# Patient Record
Sex: Female | Born: 1945 | Race: White | Hispanic: No | Smoking: Never smoker
Health system: Southern US, Community
[De-identification: ages and names within clinical notes are randomized; demographics above are authoritative.]

## PROBLEM LIST (undated history)

## (undated) DIAGNOSIS — I712 Thoracic aortic aneurysm, without rupture, unspecified: Secondary | ICD-10-CM

## (undated) DIAGNOSIS — I2510 Atherosclerotic heart disease of native coronary artery without angina pectoris: Secondary | ICD-10-CM

## (undated) DIAGNOSIS — I10 Essential (primary) hypertension: Secondary | ICD-10-CM

## (undated) DIAGNOSIS — M1990 Unspecified osteoarthritis, unspecified site: Secondary | ICD-10-CM

## (undated) DIAGNOSIS — T7840XA Allergy, unspecified, initial encounter: Secondary | ICD-10-CM

## (undated) DIAGNOSIS — I471 Supraventricular tachycardia, unspecified: Secondary | ICD-10-CM

## (undated) DIAGNOSIS — M810 Age-related osteoporosis without current pathological fracture: Secondary | ICD-10-CM

## (undated) DIAGNOSIS — R7611 Nonspecific reaction to tuberculin skin test without active tuberculosis: Secondary | ICD-10-CM

## (undated) HISTORY — PX: TONSILLECTOMY: SUR1361

## (undated) HISTORY — DX: Nonspecific reaction to tuberculin skin test without active tuberculosis: R76.11

## (undated) HISTORY — DX: Thoracic aortic aneurysm, without rupture, unspecified: I71.20

## (undated) HISTORY — DX: Allergy, unspecified, initial encounter: T78.40XA

## (undated) HISTORY — DX: Supraventricular tachycardia, unspecified: I47.10

## (undated) HISTORY — DX: Unspecified osteoarthritis, unspecified site: M19.90

## (undated) HISTORY — DX: Essential (primary) hypertension: I10

## (undated) HISTORY — DX: Age-related osteoporosis without current pathological fracture: M81.0

## (undated) HISTORY — DX: Atherosclerotic heart disease of native coronary artery without angina pectoris: I25.10

---

## 2020-02-26 ENCOUNTER — Emergency Department (HOSPITAL_COMMUNITY): Payer: Medicare Other

## 2020-02-26 ENCOUNTER — Emergency Department (HOSPITAL_COMMUNITY)
Admission: EM | Admit: 2020-02-26 | Discharge: 2020-02-27 | Disposition: A | Discharge status: Home / Self Care | Payer: Medicare Other | Attending: Emergency Medicine | Admitting: Emergency Medicine

## 2020-02-26 ENCOUNTER — Other Ambulatory Visit: Payer: Self-pay

## 2020-02-26 ENCOUNTER — Encounter (HOSPITAL_COMMUNITY): Payer: Self-pay

## 2020-02-26 DIAGNOSIS — R0789 Other chest pain: Secondary | ICD-10-CM | POA: Diagnosis present

## 2020-02-26 DIAGNOSIS — I10 Essential (primary) hypertension: Secondary | ICD-10-CM | POA: Insufficient documentation

## 2020-02-26 DIAGNOSIS — Z91040 Latex allergy status: Secondary | ICD-10-CM | POA: Insufficient documentation

## 2020-02-26 LAB — CBC
HCT: 42.6 % (ref 36.0–46.0)
Hemoglobin: 14.1 g/dL (ref 12.0–15.0)
MCH: 30.1 pg (ref 26.0–34.0)
MCHC: 33.1 g/dL (ref 30.0–36.0)
MCV: 91 fL (ref 80.0–100.0)
Platelets: 227 10*3/uL (ref 150–400)
RBC: 4.68 MIL/uL (ref 3.87–5.11)
RDW: 12.4 % (ref 11.5–15.5)
WBC: 6.6 10*3/uL (ref 4.0–10.5)
nRBC: 0 % (ref 0.0–0.2)

## 2020-02-26 LAB — BASIC METABOLIC PANEL
Anion gap: 11 (ref 5–15)
BUN: 12 mg/dL (ref 8–23)
CO2: 26 mmol/L (ref 22–32)
Calcium: 9.2 mg/dL (ref 8.9–10.3)
Chloride: 104 mmol/L (ref 98–111)
Creatinine, Ser: 0.8 mg/dL (ref 0.44–1.00)
GFR calc Af Amer: >60 mL/min (ref >60)
GFR calc non Af Amer: >60 mL/min (ref >60)
Glucose, Bld: 102 mg/dL — ABNORMAL HIGH (ref 70–99)
Potassium: 3.8 mmol/L (ref 3.5–5.1)
Sodium: 141 mmol/L (ref 135–145)

## 2020-02-26 LAB — TROPONIN I (HIGH SENSITIVITY)
Troponin I (High Sensitivity): 3 ng/L (ref <18)
Troponin I (High Sensitivity): 3 ng/L (ref <18)

## 2020-02-26 NOTE — ED Triage Notes (Signed)
Pt sent here from [facility] for further evaluation of chest tightness that lasted about 30 mins along with a funny feeling in her head. Pt denies any pain at this time. No other associated symptoms.

## 2020-02-27 DIAGNOSIS — R0789 Other chest pain: Secondary | ICD-10-CM | POA: Diagnosis not present

## 2020-02-27 MED ORDER — AMLODIPINE BESYLATE 10 MG PO TABS: 5.0000 mg | ORAL_TABLET | Freq: Every day | ORAL | 0 refills | Status: DC

## 2020-02-27 MED ORDER — AMLODIPINE BESYLATE 5 MG PO TABS
10.0000 mg | ORAL_TABLET | Freq: Once | ORAL | Status: AC
  Administered 2020-02-27: 10 mg via ORAL
  Filled 2020-02-27: qty 2

## 2020-02-27 MED ORDER — NITROGLYCERIN 0.4 MG SL SUBL
0.4000 mg | SUBLINGUAL_TABLET | SUBLINGUAL | 0 refills | Status: DC | PRN
Start: 2020-02-27 — End: 2023-06-03

## 2020-02-27 NOTE — Discharge Instructions (Signed)
Instructions  I started you on a medication for your high blood pressure called amlodipine.  Your instructions are as follows.  When you wake up in the morning, check your blood pressure immediately after waking up.  Do this with your feet planted on the ground and sitting upright.  If your blood pressure is less than 120 mmhg systolic (top number), do not take any medication.  If your blood pressure is between 120 mmhg and 140 mmhg systolic (top number), take 5 mg of amlodipine.  If your blood pressure is above 150 mmhg systolic (top number), or the bottom number is above diastolic, take 10 mg of amlodipine.  Please call your primary care doctor's office in [place] today to discuss your high blood pressure management moving forward.  Try not to eat salty foods or processed foods with high sodium.   We also talked about your chest pain.  Today your cardiac work-up was unremarkable.  However, I still would strongly recommend that you follow-up with a cardiologist, given that you have had chest tightness for several months.  You can contact your primary care doctor in [place] or your husband's cardiologist to try to arrange for rapid follow-up appointment in 1-2 weeks.  I prescribed nitroglycerin tablets for you to carry.  If you ever experience significant chest pain or tightness, place 1 tablet under your tongue and call 911.  If it helps with your pain, you can take up to 3 tablets in total, once every 5 minutes, until you are pain free.   You should still come to the ER whether or not the tablets are helping you.

## 2020-02-27 NOTE — ED Provider Notes (Signed)
[facility] EMERGENCY DEPARTMENT Provider Note   CSN: [id number] Arrival date & time: 02/26/20  1558     History Chief Complaint  Patient presents with  . Chest Pain    Angela Daniels is a 74 y.o. female with a history of hypertension (not on BP meds) presented to emergency department with high blood pressure and chest tightness.  Patient originally lives in [place] and is here in [place] visiting family.  She reports that for the past several weeks, she has intermittently noted lightheadedness and jitteriness at home.  She has not been regularly taking her blood pressure, but attributed this to "seasonal allergies" which she suffers from, while living in [place].  She has not taken any decongestants regularly for this.    Yesterday she went to an urgent care, where she was noted to be hypertensive, and also reportedly there were concerns for "inferior infarct pattern" on her ECG (which she presents with), and therefore she was told to come to the emergency department.  She unfortunately spent 12 to 14 hours in the waiting room overnight.  Upon my evaluation in the morning, she continues to be hypertensive but denies any jitteriness and reports that her chest tightness is minimal.  She reports that for several months she has noted episodes of chest tightness, which seem to occur while she is at rest only ("when I'm sitting still and thinking about it") but not worse with activity.  She has never seen a cardiologist.  She denies any history of cardiac disease or MI.  She denies any significant family history of MI.  She does not smoke.  She denies history of high cholesterol or diabetes.  Normally her BP has ranged 120-140 mmhg systolic and around 80 mg diastolic, but she does not check every day.  HPI     History reviewed. No pertinent past medical history.  There are no problems to display for this patient.   History reviewed. No pertinent surgical history.   OB  History   No obstetric history on file.     No family history on file.  Social History   Tobacco Use  . Smoking status: Not on file  Substance Use Topics  . Alcohol use: Not on file  . Drug use: Not on file    Home Medications Prior to Admission medications   Medication Sig Start Date End Date Taking? Authorizing Provider  amLODipine (NORVASC) 10 MG tablet Take 0.5 tablets (5 mg total) by mouth daily for 90 doses. 02/27/20 05/27/20  Terald Sleeper, MD  nitroGLYCERIN (NITROSTAT) 0.4 MG SL tablet Place 1 tablet (0.4 mg total) under the tongue every 5 (five) minutes as needed for chest pain. Do not take more than 3 tablets in a row 02/27/20 03/28/20  Terald Sleeper, MD    Allergies    Shellfish allergy, Iodine, Latex, and Sulfa antibiotics  Review of Systems   Review of Systems  Constitutional: Positive for fatigue. Negative for chills and fever.  HENT: Negative for ear pain and sore throat.   Eyes: Negative for pain and visual disturbance.  Respiratory: Negative for cough and shortness of breath.   Cardiovascular: Positive for chest pain. Negative for palpitations.  Gastrointestinal: Negative for abdominal pain, nausea and vomiting.  Genitourinary: Negative for dysuria and hematuria.  Musculoskeletal: Negative for arthralgias and back pain.  Skin: Negative for color change and rash.  Neurological: Positive for light-headedness. Negative for syncope and weakness.  All other systems reviewed and are negative.  Physical Exam Updated Vital Signs BP 131/84   Pulse 75   Temp 98 F (36.7 C) (Oral)   Resp 16   SpO2 100%   Physical Exam Vitals and nursing note reviewed.  Constitutional:      General: She is not in acute distress.    Appearance: She is well-developed.  HENT:     Head: Normocephalic and atraumatic.  Eyes:     Conjunctiva/sclera: Conjunctivae normal.  Cardiovascular:     Rate and Rhythm: Normal rate and regular rhythm.  Pulmonary:     Effort:  Pulmonary effort is normal. No respiratory distress.  Abdominal:     Palpations: Abdomen is soft.     Tenderness: There is no abdominal tenderness.  Musculoskeletal:     Cervical back: Neck supple.  Skin:    General: Skin is warm and dry.  Neurological:     General: No focal deficit present.     Mental Status: She is alert and oriented to person, place, and time.     ED Results / Procedures / Treatments   Labs (all labs ordered are listed, but only abnormal results are displayed) Labs Reviewed  BASIC METABOLIC PANEL - Abnormal; Notable for the following components:      Result Value   Glucose, Bld 102 (*)    All other components within normal limits  CBC  TROPONIN I (HIGH SENSITIVITY)  TROPONIN I (HIGH SENSITIVITY)    EKG EKG Interpretation  Date/Time:  Tuesday February 26 2020 16:14:42 EDT Ventricular Rate:  91 PR Interval:  154 QRS Duration: 82 QT Interval:  356 QTC Calculation: 437 R Axis:   115 Text Interpretation: Normal sinus rhythm Right axis deviation Right ventricular hypertrophy Abnormal ECG No old tracing to compare Confirmed by Dione Booze ([id number]) on 02/27/2020 7:05:19 AM   Radiology DG Chest 2 View  Result Date: 02/26/2020 CLINICAL DATA:  Chest pain and pressure for 1 day, anxiety EXAM: CHEST - 2 VIEW COMPARISON:  None. FINDINGS: Frontal and lateral views of the chest demonstrate an unremarkable cardiac silhouette. No airspace disease, effusion, or pneumothorax. Background emphysema is noted. No acute bony abnormalities. IMPRESSION: 1. Emphysema, no acute intrathoracic process. Electronically Signed   By: Sharlet Salina M.D.   On: 02/26/2020 16:42    Procedures Procedures (including critical care time)  Medications Ordered in ED Medications  amLODipine (NORVASC) tablet 10 mg (10 mg Oral Given 02/27/20 8413)    ED Course  I have reviewed the triage vital signs and the nursing notes.  Pertinent labs & imaging results that were available during my care of  the patient were reviewed by me and considered in my medical decision making (see chart for details).  74 yo female presenting to ED from urgent care with elevated blood pressure.  She reports there were concerns on her ECG for an inferior infarct.   I reviewed a printout of this ECG from urgent care which showed possible Q-waves in leads III and aVF, with low amplitude.  Her ECG here does not show these q waves - I suspect it may have been a lead placement issue.    Per my interpretation her ECG here is a NSR with no evidence of ischemia.  Her delta troponin is flat and unremarkable.  I have a low suspicion overall for ACS at this time.  However, given her HEART score of 3, and her symptoms ongoing for several months, I did make a strong recommendation that she f/u with a cardiologist upon returning  to [place] this week.  Her husband sees a cardiologist - she will try to get in with them.  I also talked about prescribing nitro SL for as needed use this week as she is in transit to go home to [place].  Regarding her HTN, we'll start on norvasc here a 5-10 mg daily regimen, with titration perimeters in her discharge.  She has a BP cuff at home and will keep a log.  She'll need to call her PCP about this issue.  At this time I have a low suspicion for hypertensive emergency, stroke, or ACS.  Cr is 0.8.  Trop is 3 ->3.  BP improved after PO meds here 10 mg norvasc given.  Okay for discharge.  I personally reviewed her labs I personally reviewed her xray which per my interpretation shows no focal consolidation or PTX     Final Clinical Impression(s) / ED Diagnoses Final diagnoses:  Chest pressure  Hypertension, unspecified type    Rx / DC Orders ED Discharge Orders         Ordered    amLODipine (NORVASC) 10 MG tablet  Daily     Discontinue  Reprint     02/27/20 0832    nitroGLYCERIN (NITROSTAT) 0.4 MG SL tablet  Every 5 min PRN     Discontinue  Reprint     02/27/20 4332           Terald Sleeper, MD 02/27/20 [id number]

## 2020-11-22 IMAGING — CR DG CHEST 2V
2 series · 2 of 2 positions shown · non-contrast
Comparison: None.

CLINICAL DATA: Chest pain and pressure for 1 day, anxiety

EXAM:
CHEST - 2 VIEW

[chest pa]
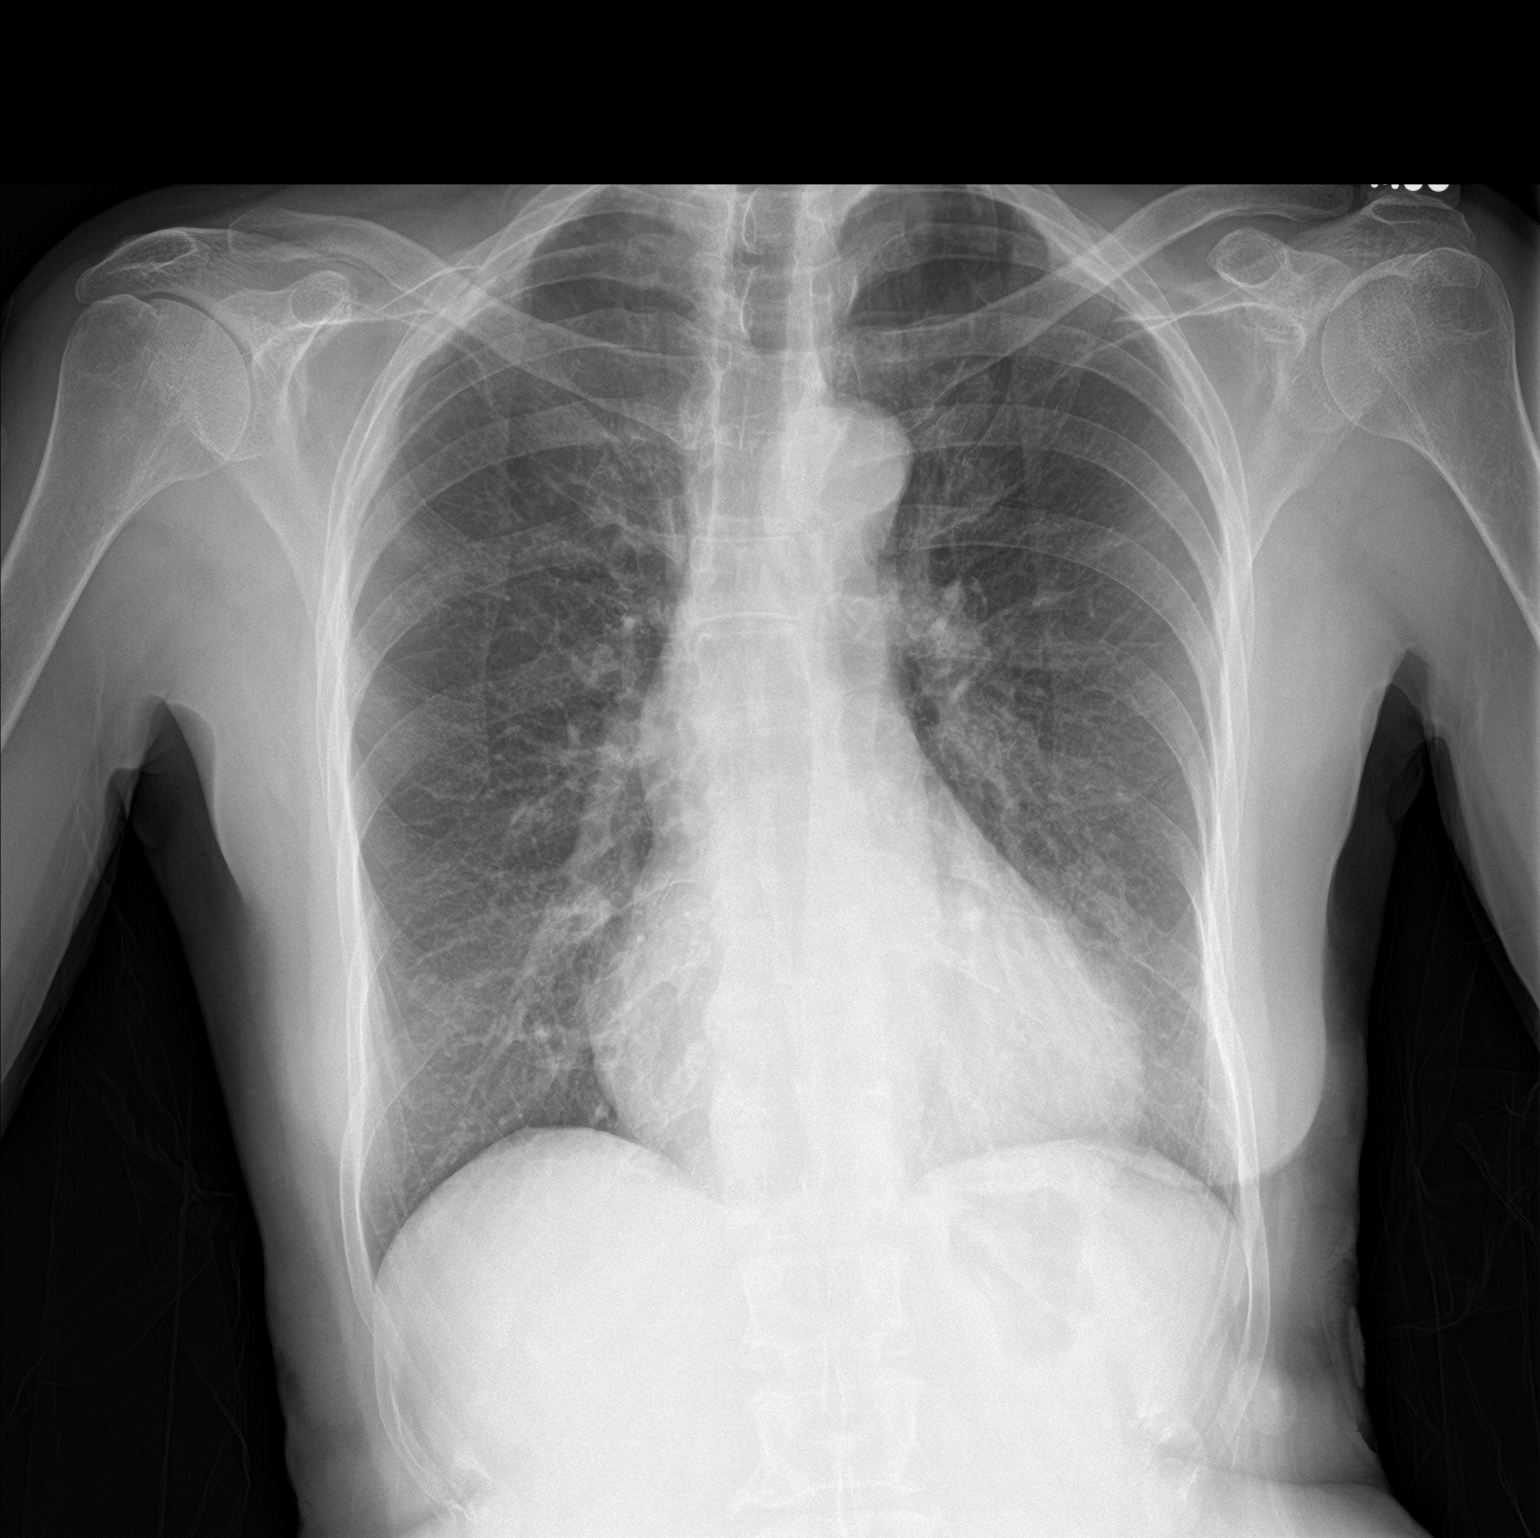

[chest lat]
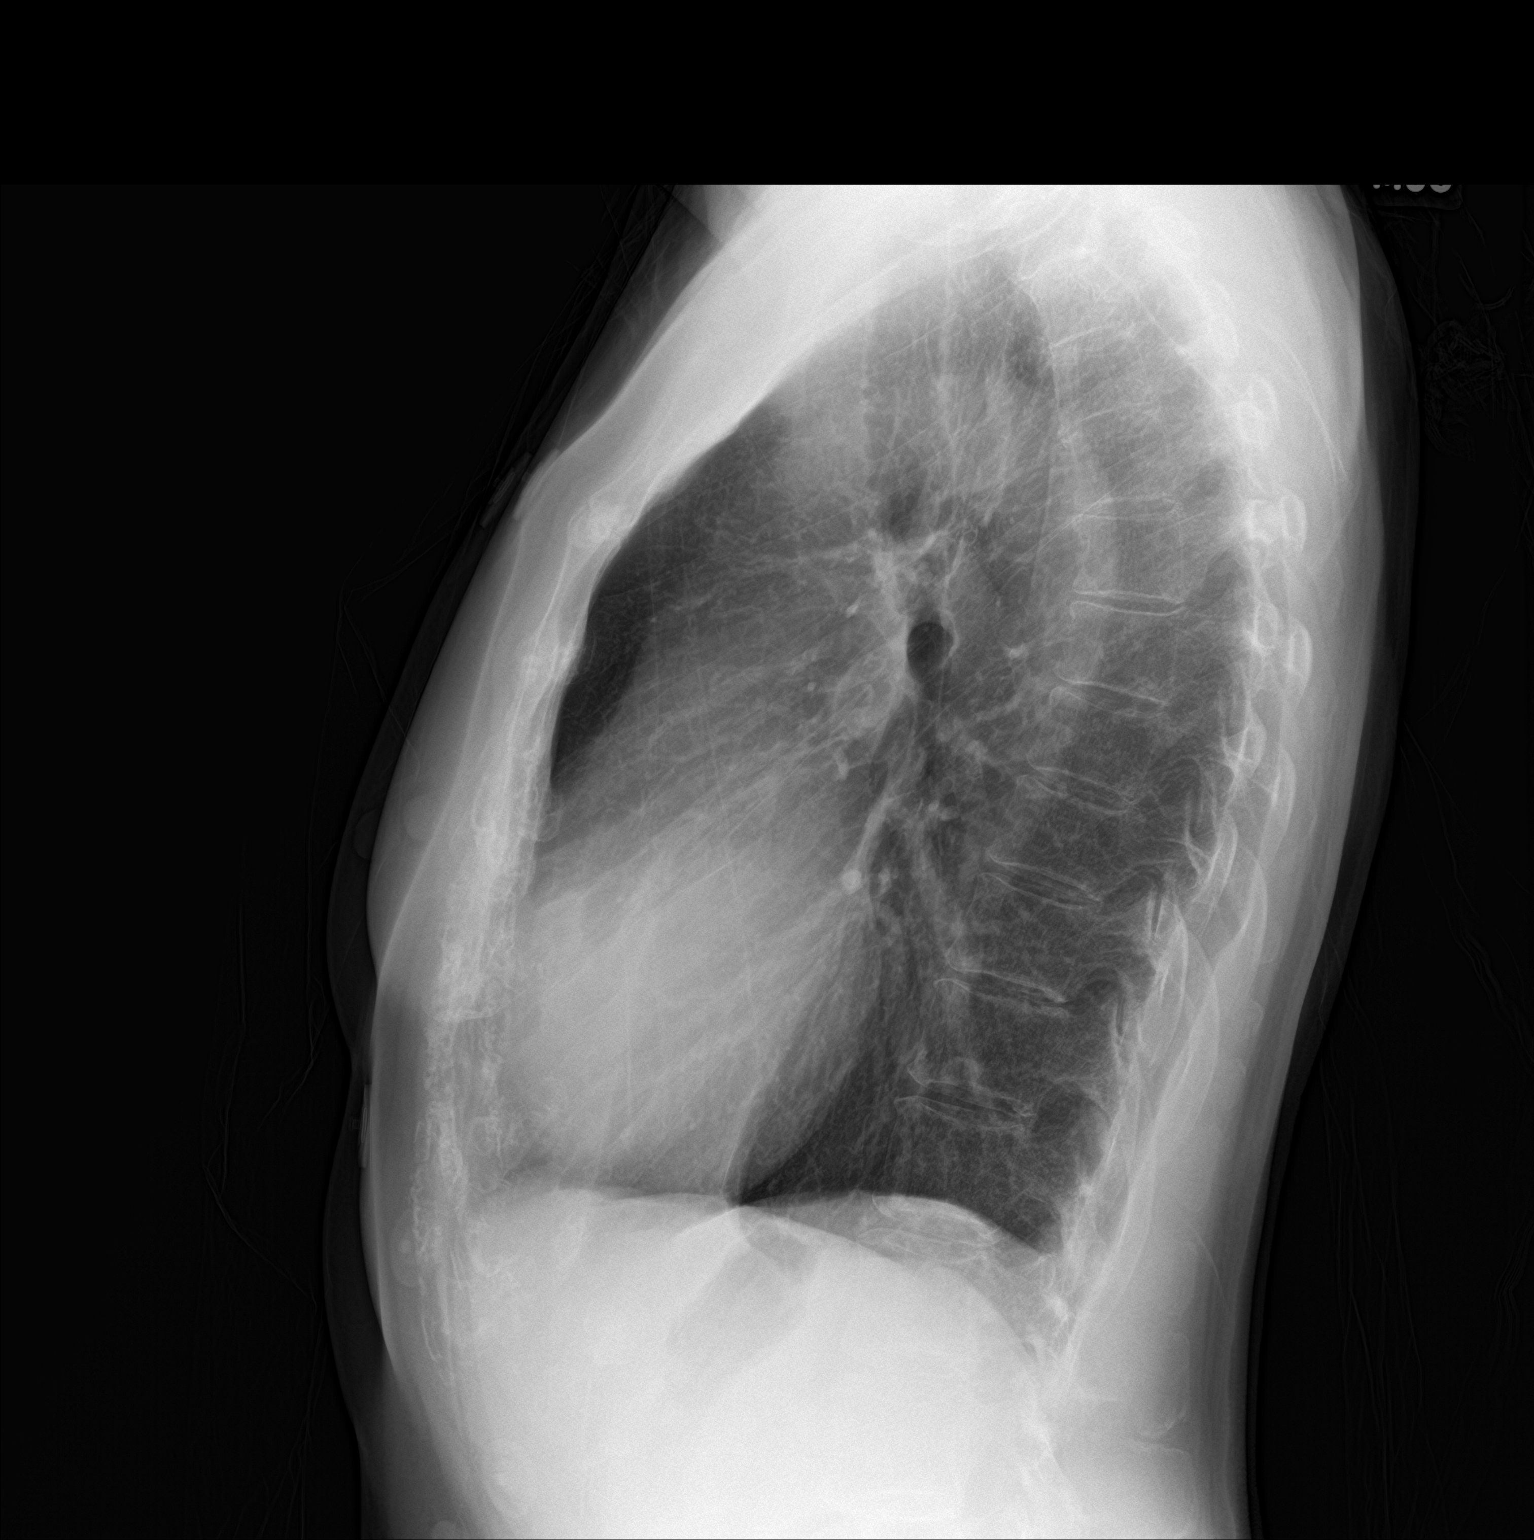

[2 of 2 positions shown; findings below may reference images not displayed]

FINDINGS: Frontal and lateral views of the chest demonstrate an unremarkable
cardiac silhouette. No airspace disease, effusion, or pneumothorax.
Background emphysema is noted. No acute bony abnormalities.
IMPRESSION: 1. Emphysema, no acute intrathoracic process.

## 2023-06-03 ENCOUNTER — Ambulatory Visit (INDEPENDENT_AMBULATORY_CARE_PROVIDER_SITE_OTHER): Payer: Medicare Other | Admitting: Family Medicine

## 2023-06-03 VITALS — BP 144/92 | HR 70 | Temp 98.2°F | Ht 68.5 in | Wt 167.9 lb

## 2023-06-03 DIAGNOSIS — M810 Age-related osteoporosis without current pathological fracture: Secondary | ICD-10-CM | POA: Insufficient documentation

## 2023-06-03 DIAGNOSIS — R3121 Asymptomatic microscopic hematuria: Secondary | ICD-10-CM | POA: Insufficient documentation

## 2023-06-03 DIAGNOSIS — R448 Other symptoms and signs involving general sensations and perceptions: Secondary | ICD-10-CM | POA: Diagnosis not present

## 2023-06-03 DIAGNOSIS — R030 Elevated blood-pressure reading, without diagnosis of hypertension: Secondary | ICD-10-CM | POA: Insufficient documentation

## 2023-06-03 DIAGNOSIS — N200 Calculus of kidney: Secondary | ICD-10-CM | POA: Insufficient documentation

## 2023-06-03 DIAGNOSIS — Z1231 Encounter for screening mammogram for malignant neoplasm of breast: Secondary | ICD-10-CM

## 2023-06-03 LAB — VITAMIN B12: Vitamin B-12: 377 pg/mL (ref 211–911)

## 2023-06-03 NOTE — Assessment & Plan Note (Signed)
Was being followed by urology for this in the past, will continue to monitor kidney function with her annual labs/ check up

## 2023-06-03 NOTE — Assessment & Plan Note (Signed)
On Prolia injections ever 6 months, pt reports she is due for her injection, will send patient case to our coordinator to get her set up.

## 2023-06-03 NOTE — Assessment & Plan Note (Signed)
Pt had a previous hx of HTN, however she reports this resolved and her BP's at home are usually in the normal range. I recommended that she continue to monitor her BP at home and message me the readings every week.

## 2023-06-03 NOTE — Progress Notes (Signed)
New Patient Office Visit  Subjective    Patient ID: Angela Daniels, female    DOB: December 06, 1945  Age: 77 y.o. MRN: [id number]  CC:  Chief Complaint  Patient presents with   Establish Care    HPI Angela Daniels presents to establish care Recently moved here from [place]. She reports her husband passed away in 02/13/24states that she used to be very physically active  before that but since then she has changed her exercise patterns. States that she feels a little "out of whack", has noticed that her varicose veins seem to be larger/worse. States she is getting a "blotch" on the skin of her right foot, states that when she puts her legs up the blotch gets better. Also she is reporting "bumps or ulcers" on her tongue, then tip of her tongue is red and "feels funny", states that it went away once but now it is back and it is not going away.   Pt reports that she normally has regular readings for blood pressure-- states that she has had a lot of stress over the last year. Still unpacking boxes and adjusting to her new environment. States that she did have a high BP reading in 2021, was placed on amlodipine at the time but then she started experiencing low BP so the medication was discontinued.   Pt has a history of osteoporosis -- is on Prolia injections every 6 months, last DEXA scan was done in the last year. She reports she is coming up due for the injection, states last injection was April 15. States that she is also coming up due for her annual bloodwork.   Current Outpatient Medications  Medication Instructions   denosumab (PROLIA) 60 mg, Subcutaneous, Every 6 months   loratadine (CLARITIN) 10 mg, Oral, Daily    Past Medical History:  Diagnosis Date   Allergy    Arthritis    Hypertension    Osteoporosis    Positive TB test     Past Surgical History:  Procedure Laterality Date   TONSILLECTOMY     1954    Family History  Problem Relation Age of Onset   Hyperlipidemia  Mother    Hypertension Mother    Hearing loss Mother    Kidney disease Father    Cancer Father    Hypertension Sister    Hypertension Brother    Cancer Brother     Social History   Socioeconomic History   Marital status: Widowed    Spouse name: Not on file   Number of children: Not on file   Years of education: Not on file   Highest education level: Bachelor's degree (e.g., BA, AB, BS)  Occupational History   Not on file  Tobacco Use   Smoking status: Never   Smokeless tobacco: Never  Vaping Use   Vaping status: Never Used  Substance and Sexual Activity   Alcohol use: Yes    Comment: occasionally   Drug use: Never   Sexual activity: Not Currently  Other Topics Concern   Not on file  Social History Narrative   Not on file   Social Determinants of Health   Financial Resource Strain: Low Risk  (06/03/2023)   Overall Financial Resource Strain (CARDIA)    Difficulty of Paying Living Expenses: Not hard at all  Food Insecurity: No Food Insecurity (06/03/2023)   Hunger Vital Sign    Worried About Running Out of Food in the Last Year: Never true    Ran  Out of Food in the Last Year: Never true  Transportation Needs: No Transportation Needs (06/03/2023)   PRAPARE - Administrator, Civil Service (Medical): No    Lack of Transportation (Non-Medical): No  Physical Activity: Sufficiently Active (06/03/2023)   Exercise Vital Sign    Days of Exercise per Week: 5 days    Minutes of Exercise per Session: 120 min  Stress: Patient Declined (06/03/2023)   [employer] of Occupational Health - Occupational Stress Questionnaire    Feeling of Stress : Patient declined  Social Connections: Unknown (06/03/2023)   Social Connection and Isolation Panel [NHANES]    Frequency of Communication with Friends and Family: More than three times a week    Frequency of Social Gatherings with Friends and Family: More than three times a week    Attends Religious Services: Patient  declined    Database administrator or Organizations: No    Attends Banker Meetings: Not on file    Marital Status: Widowed  Intimate Partner Violence: Not on file    Review of Systems  All other systems reviewed and are negative.       Objective    BP (!) 144/92 (BP Location: Right Arm, Patient Position: Sitting, Cuff Size: Normal)   Pulse 70   Temp 98.2 F (36.8 C) (Oral)   Ht 5' 8.5" (1.74 m)   Wt 167 lb 14.4 oz (76.2 kg)   SpO2 99%   BMI 25.16 kg/m   Physical Exam Vitals reviewed.  Constitutional:      Appearance: Normal appearance. She is well-groomed and normal weight.  Eyes:     Conjunctiva/sclera: Conjunctivae normal.  Neck:     Thyroid: No thyromegaly.  Cardiovascular:     Rate and Rhythm: Normal rate and regular rhythm.     Pulses: Normal pulses.     Heart sounds: S1 normal and S2 normal.  Pulmonary:     Effort: Pulmonary effort is normal.     Breath sounds: Normal breath sounds and air entry.  Abdominal:     General: Bowel sounds are normal.  Musculoskeletal:     Right lower leg: No edema.     Left lower leg: No edema.  Neurological:     Mental Status: She is alert and oriented to person, place, and time. Mental status is at baseline.     Gait: Gait is intact.  Psychiatric:        Mood and Affect: Mood and affect normal.        Speech: Speech normal.        Behavior: Behavior normal.        Judgment: Judgment normal.         Assessment & Plan:  Age-related osteoporosis without current pathological fracture Assessment & Plan: On Prolia injections ever 6 months, pt reports she is due for her injection, will send patient case to our coordinator to get her set up.   Paresthesia of tongue Tongue and oropharynx appear clear normal on exam. It could be a small cyst on the left side of her tongue, reassured patient and advised we check B12 level to rule out deficiency  -     Vitamin B12  Breast cancer screening by mammogram -      3D Screening Mammogram, Left and Right; Future  Elevated blood pressure reading without diagnosis of hypertension Assessment & Plan: Pt had a previous hx of HTN, however she reports this resolved and her BP's at home  are usually in the normal range. I recommended that she continue to monitor her BP at home and message me the readings every week.    Asymptomatic microscopic hematuria Assessment & Plan: Was being followed by urology for this in the past, will continue to monitor kidney function with her annual labs/ check up      Return for AWV- with me in person when she is due-- she thinks her medicare visit is in November .   Karie Georges, MD

## 2023-06-08 ENCOUNTER — Encounter: Payer: Self-pay | Admitting: Family Medicine

## 2023-06-10 ENCOUNTER — Telehealth: Payer: Self-pay

## 2023-06-10 NOTE — Telephone Encounter (Signed)
Pt ready for scheduling at any time.  Estimated out-of-pocket cost due at time of visit: $0  Primary Insurance: Medicare Prolia co-insurance: 0%  Deductible: $240 out of $240  This summary of benefits is an estimation of the patient's out-of-pocket cost. Exact cost may vary based on individual plan coverage.

## 2023-06-23 ENCOUNTER — Ambulatory Visit: Payer: Medicare Other | Admitting: Family Medicine

## 2023-06-23 ENCOUNTER — Other Ambulatory Visit (INDEPENDENT_AMBULATORY_CARE_PROVIDER_SITE_OTHER): Payer: Medicare Other

## 2023-06-23 ENCOUNTER — Encounter: Payer: Self-pay | Admitting: Family Medicine

## 2023-06-23 VITALS — BP 140/100 | HR 80 | Temp 98.2°F | Ht 69.5 in | Wt 167.1 lb

## 2023-06-23 DIAGNOSIS — M810 Age-related osteoporosis without current pathological fracture: Secondary | ICD-10-CM | POA: Diagnosis not present

## 2023-06-23 DIAGNOSIS — Z13220 Encounter for screening for lipoid disorders: Secondary | ICD-10-CM | POA: Diagnosis not present

## 2023-06-23 DIAGNOSIS — L821 Other seborrheic keratosis: Secondary | ICD-10-CM | POA: Diagnosis not present

## 2023-06-23 DIAGNOSIS — Z0000 Encounter for general adult medical examination without abnormal findings: Secondary | ICD-10-CM

## 2023-06-23 DIAGNOSIS — R030 Elevated blood-pressure reading, without diagnosis of hypertension: Secondary | ICD-10-CM

## 2023-06-23 LAB — LIPID PANEL
Cholesterol: 201 mg/dL — ABNORMAL HIGH (ref 0–200)
HDL: 63.2 mg/dL (ref >39.00)
LDL Cholesterol: 123 mg/dL — ABNORMAL HIGH (ref 0–99)
NonHDL: 137.73
Total CHOL/HDL Ratio: 3
Triglycerides: 73 mg/dL (ref 0.0–149.0)
VLDL: 14.6 mg/dL (ref 0.0–40.0)

## 2023-06-23 LAB — COMPREHENSIVE METABOLIC PANEL
ALT: 14 U/L (ref 0–35)
AST: 17 U/L (ref 0–37)
Albumin: 4.3 g/dL (ref 3.5–5.2)
Alkaline Phosphatase: 48 U/L (ref 39–117)
BUN: 12 mg/dL (ref 6–23)
CO2: 31 meq/L (ref 19–32)
Calcium: 9.5 mg/dL (ref 8.4–10.5)
Chloride: 103 meq/L (ref 96–112)
Creatinine, Ser: 0.73 mg/dL (ref 0.40–1.20)
GFR: 80.6 mL/min (ref >60.00)
Glucose, Bld: 92 mg/dL (ref 70–99)
Potassium: 4 meq/L (ref 3.5–5.1)
Sodium: 141 meq/L (ref 135–145)
Total Bilirubin: 0.6 mg/dL (ref 0.2–1.2)
Total Protein: 7.7 g/dL (ref 6.0–8.3)

## 2023-06-23 LAB — VITAMIN D 25 HYDROXY (VIT D DEFICIENCY, FRACTURES): VITD: 28.18 ng/mL — ABNORMAL LOW (ref 30.00–100.00)

## 2023-06-23 NOTE — Progress Notes (Signed)
Subjective:   Angela Daniels is a 77 y.o. female who presents for Medicare Annual (Subsequent) preventive examination.  Visit Complete: In person  Patient Medicare AWV questionnaire was completed by the patient on 06/23/2023; I have confirmed that all information answered by patient is correct and no changes since this date.    I have extensively reviewed her health maintenance, last DEXA was 04/27/2022, she is UTD on her immunizations including pneumonia and shingles. Pt reports she will be due in 2 years for her colonoscopy.     Objective:    Today's Vitals   06/23/23 0949  BP: (!) 140/100  Pulse: 80  Temp: 98.2 F (36.8 C)  TempSrc: Oral  SpO2: 98%  Weight: 167 lb 1.6 oz (75.8 kg)  Height: 5' 9.5" (1.765 m)   Body mass index is 24.32 kg/m.     06/23/2023   10:25 AM  Advanced Directives  Does Patient Have a Medical Advance Directive? Yes  Type of Estate agent of [place];Living will  Does patient want to make changes to medical advance directive? No - Patient declined  Copy of Healthcare Power of Attorney in Chart? No - copy requested      06/23/2023   10:22 AM  6CIT Screen  What Year? 0 points  What month? 0 points  What time? 0 points  Count back from 20 0 points  Months in reverse 0 points  Repeat phrase 0 points  Total Score 0 points     Current Medications (verified) Outpatient Encounter Medications as of 06/23/2023  Medication Sig   denosumab (PROLIA) 60 MG/ML SOSY injection Inject 60 mg into the skin every 6 (six) months.   loratadine (CLARITIN) 10 MG tablet Take 10 mg by mouth daily.   No facility-administered encounter medications on file as of 06/23/2023.    Allergies (verified) Shellfish allergy, Iodine, Latex, and Sulfa antibiotics   History: Past Medical History:  Diagnosis Date   Allergy    Arthritis    Hypertension    Osteoporosis    Positive TB test    Past Surgical History:  Procedure Laterality Date    TONSILLECTOMY     1954   Family History  Problem Relation Age of Onset   Hyperlipidemia Mother    Hypertension Mother    Hearing loss Mother    Kidney disease Father    Cancer Father    Hypertension Sister    Hypertension Brother    Cancer Brother    Social History   Socioeconomic History   Marital status: Widowed    Spouse name: Not on file   Number of children: Not on file   Years of education: Not on file   Highest education level: Bachelor's degree (e.g., BA, AB, BS)  Occupational History   Not on file  Tobacco Use   Smoking status: Never   Smokeless tobacco: Never  Vaping Use   Vaping status: Never Used  Substance and Sexual Activity   Alcohol use: Yes    Comment: occasionally   Drug use: Never   Sexual activity: Not Currently  Other Topics Concern   Not on file  Social History Narrative   Not on file   Social Determinants of Health   Financial Resource Strain: Low Risk  (06/03/2023)   Overall Financial Resource Strain (CARDIA)    Difficulty of Paying Living Expenses: Not hard at all  Food Insecurity: No Food Insecurity (06/03/2023)   Hunger Vital Sign    Worried About Running Out  of Food in the Last Year: Never true    Ran Out of Food in the Last Year: Never true  Transportation Needs: No Transportation Needs (06/03/2023)   PRAPARE - Administrator, Civil Service (Medical): No    Lack of Transportation (Non-Medical): No  Physical Activity: Sufficiently Active (06/03/2023)   Exercise Vital Sign    Days of Exercise per Week: 5 days    Minutes of Exercise per Session: 120 min  Stress: Patient Declined (06/03/2023)   [employer] of Occupational Health - Occupational Stress Questionnaire    Feeling of Stress : Patient declined  Social Connections: Unknown (06/03/2023)   Social Connection and Isolation Panel [NHANES]    Frequency of Communication with Friends and Family: More than three times a week    Frequency of Social Gatherings with  Friends and Family: More than three times a week    Attends Religious Services: Patient declined    Database administrator or Organizations: No    Attends Banker Meetings: Not on file    Marital Status: Widowed    Tobacco Counseling Counseling given: Not Answered  How often do you need to have someone help you when you read instructions, pamphlets, or other written materials from your doctor or pharmacy?: (P) 1 - Never       Activities of Daily Living    06/23/2023    8:42 AM  In your present state of health, do you have any difficulty performing the following activities:  Hearing? 0  Vision? 0  Difficulty concentrating or making decisions? 0  Walking or climbing stairs? 0  Dressing or bathing? 0  Doing errands, shopping? 0  Preparing Food and eating ? N  Using the Toilet? N  In the past six months, have you accidently leaked urine? N  Do you have problems with loss of bowel control? N  Managing your Medications? N  Managing your Finances? N  Housekeeping or managing your Housekeeping? N    Patient Care Team: Karie Georges, MD as PCP - General (Family Medicine)  Indicate any recent Medical Services you may have received from other than [facility] providers in the past year (date may be approximate). Physical Exam Vitals reviewed.  Constitutional:      Appearance: Normal appearance. She is normal weight.  HENT:     Right Ear: Tympanic membrane and ear canal normal.     Left Ear: Tympanic membrane and ear canal normal.     Mouth/Throat:     Mouth: Mucous membranes are moist.     Pharynx: No posterior oropharyngeal erythema.  Eyes:     Conjunctiva/sclera: Conjunctivae normal.  Neck:     Thyroid: No thyromegaly.  Cardiovascular:     Rate and Rhythm: Normal rate and regular rhythm.     Pulses: Normal pulses.     Heart sounds: Normal heart sounds. No murmur heard. Pulmonary:     Effort: Pulmonary effort is normal.     Breath sounds: Normal breath  sounds. No wheezing, rhonchi or rales.  Abdominal:     General: Bowel sounds are normal.  Musculoskeletal:     Right lower leg: Edema (mild edema due to varicose veins) present.     Left lower leg: Edema (mild edema due to varicose veins) present.  Neurological:     Mental Status: She is alert and oriented to person, place, and time. Mental status is at baseline.  Psychiatric:        Mood and  Affect: Mood normal.        Behavior: Behavior normal.        Assessment:   This is a routine wellness examination for [place].  Hearing/Vision screen No results found.   Goals Addressed   None   Depression Screen    06/23/2023   10:21 AM  PHQ 2/9 Scores  PHQ - 2 Score 0    Fall Risk    06/23/2023    9:47 AM 06/23/2023    8:42 AM  Fall Risk   Falls in the past year? 0 0  Number falls in past yr: 0   Injury with Fall? 0   Risk for fall due to : No Fall Risks   Follow up Falls evaluation completed     MEDICARE RISK AT HOME: Medicare Risk at Home Any stairs in or around the home?: (P) No If so, are there any without handrails?: (P) No Home free of loose throw rugs in walkways, pet beds, electrical cords, etc?: (P) Yes Adequate lighting in your home to reduce risk of falls?: (P) Yes Life alert?: (P) No Use of a cane, walker or w/c?: (P) No Grab bars in the bathroom?: (P) Yes Shower chair or bench in shower?: (P) No Elevated toilet seat or a handicapped toilet?: (P) No  TIMED UP AND GO:  Was the test performed?  Yes  Length of time to ambulate 10 feet: 6 sec Gait steady and fast without use of assistive device    Cognitive Function:        06/23/2023   10:22 AM  6CIT Screen  What Year? 0 points  What month? 0 points  What time? 0 points  Count back from 20 0 points  Months in reverse 0 points  Repeat phrase 0 points  Total Score 0 points    Immunizations Immunization History  Administered Date(s) Administered   Influenza-Unspecified 05/30/2023   Moderna  Covid-19 Vaccine Bivalent Booster 33yrs & up 04/21/2021   Moderna Sars-Covid-2 Vaccination 09/07/2019, 10/06/2019, 04/14/2020   Tdap 08/16/2013    TDAP status: Up to date  Flu Vaccine status: Up to date  Pneumococcal vaccine status: Up to date  Covid-19 vaccine status: Completed vaccines  Qualifies for Shingles Vaccine? Yes   Zostavax completed Yes   Shingrix Completed?: Yes  Screening Tests Health Maintenance  Topic Date Due   Colonoscopy  Never done   Pneumonia Vaccine 27+ Years old (1 of 1 - PCV) Never done   Zoster Vaccines- Shingrix (2 of 2) 12/24/2021   COVID-19 Vaccine (6 - 2023-24 season) 07/09/2023 (Originally 04/17/2023)   Hepatitis C Screening  06/02/2024 (Originally 04/29/1964)   DTaP/Tdap/Td (2 - Td or Tdap) 08/17/2023   Medicare Annual Wellness (AWV)  06/22/2024   INFLUENZA VACCINE  Completed   DEXA SCAN  Completed   HPV VACCINES  Aged Out    Health Maintenance  Health Maintenance Due  Topic Date Due   Colonoscopy  Never done   Pneumonia Vaccine 53+ Years old (1 of 1 - PCV) Never done   Zoster Vaccines- Shingrix (2 of 2) 12/24/2021    Colorectal cancer screening: No longer required.   Mammogram status: Completed 05/02/2022. Repeat every year  Bone Density status: Completed 04/27/2022. Results reflect: Bone density results: OSTEOPOROSIS. Repeat every 2 years.  Lung Cancer Screening: (Low Dose CT Chest recommended if Age 22-80 years, 20 pack-year currently smoking OR have quit w/in 15years.) does not qualify.   Lung Cancer Screening Referral: N/A  Additional Screening:  Vision Screening: Recommended annual ophthalmology exams for early detection of glaucoma and other disorders of the eye. Is the patient up to date with their annual eye exam?   pt did have one, hasn't found a new one yet Who is the provider or what is the name of the office in which the patient attends annual eye exams? [place]-- her previous eye doctor was in a  nother state If pt is not established with a provider, would they like to be referred to a provider to establish care?  N/A .   Dental Screening: Recommended annual dental exams for proper oral hygiene, used to have one previously but recently moved here and has yet to find   [employer] Referral / Chronic Care Management: CRR required this visit?  No   CCM required this visit?  No  Problem List Items Addressed This Visit       Unprioritized   Osteoporosis   Relevant Orders   Vitamin D, 25-hydroxy (Completed)   Elevated blood pressure reading without diagnosis of hypertension   Relevant Orders   CMP (Completed)   Other Visit Diagnoses     Encounter for Medicare annual wellness exam    -  Primary   Seborrheic keratosis       Relevant Orders   Ambulatory referral to Dermatology   Lipid screening       Relevant Orders   Lipid panel (Completed)         Plan:     I have personally reviewed and noted the following in the patient's chart:   Medical and social history Use of alcohol, tobacco or illicit drugs  Current medications and supplements including opioid prescriptions. Patient is not currently taking opioid prescriptions. Functional ability and status Nutritional status Physical activity Advanced directives List of other physicians Hospitalizations, surgeries, and ER visits in previous 12 months Vitals Screenings to include cognitive, depression, and falls Referrals and appointments  In addition, I have reviewed and discussed with patient certain preventive protocols, quality metrics, and best practice recommendations. A written personalized care plan for preventive services as well as general preventive health recommendations were provided to patient.     Karie Georges, MD   06/23/2023   After Visit Summary: (In Person-Printed) AVS printed and given to the patient

## 2023-06-30 ENCOUNTER — Telehealth: Payer: Self-pay | Admitting: Family Medicine

## 2023-06-30 NOTE — Telephone Encounter (Signed)
Noted  

## 2023-06-30 NOTE — Telephone Encounter (Signed)
Pt called to say she received a notice stating her Prolia Shots were approved. Pt has been scheduled to come in Monday afternoon (07/04/23).

## 2023-07-04 ENCOUNTER — Ambulatory Visit: Payer: Medicare Other | Admitting: *Deleted

## 2023-07-04 ENCOUNTER — Encounter: Payer: Self-pay | Admitting: Family Medicine

## 2023-07-04 DIAGNOSIS — E782 Mixed hyperlipidemia: Secondary | ICD-10-CM

## 2023-07-04 DIAGNOSIS — M810 Age-related osteoporosis without current pathological fracture: Secondary | ICD-10-CM

## 2023-07-04 MED ORDER — DENOSUMAB 60 MG/ML ~~LOC~~ SOSY
60.0000 mg | PREFILLED_SYRINGE | Freq: Once | SUBCUTANEOUS | Status: AC
  Administered 2023-07-04: 60 mg via SUBCUTANEOUS

## 2023-07-04 NOTE — Progress Notes (Signed)
Per orders of Dr. Casimiro Needle, injection of Prolia 60mg  given by Johnella Moloney. Patient tolerated injection well.

## 2023-07-05 ENCOUNTER — Ambulatory Visit
Admission: RE | Admit: 2023-07-05 | Discharge: 2023-07-05 | Disposition: A | Discharge status: Home / Self Care | Payer: Medicare Other | Source: Ambulatory Visit | Attending: Family Medicine | Admitting: Family Medicine

## 2023-07-05 DIAGNOSIS — Z1231 Encounter for screening mammogram for malignant neoplasm of breast: Secondary | ICD-10-CM

## 2023-07-12 ENCOUNTER — Other Ambulatory Visit: Payer: Medicare Other

## 2023-07-13 ENCOUNTER — Ambulatory Visit
Admission: RE | Admit: 2023-07-13 | Discharge: 2023-07-13 | Disposition: A | Discharge status: Home / Self Care | Payer: No Typology Code available for payment source | Source: Ambulatory Visit | Attending: Family Medicine | Admitting: Family Medicine

## 2023-07-13 DIAGNOSIS — E782 Mixed hyperlipidemia: Secondary | ICD-10-CM

## 2023-07-18 ENCOUNTER — Encounter: Payer: Self-pay | Admitting: Family Medicine

## 2023-07-19 NOTE — Telephone Encounter (Signed)
Spoke with the patient and scheduled a visit on 12/4 to arrive at 10:45am.

## 2023-07-19 NOTE — Telephone Encounter (Signed)
Ok to schedule an appointment for the patient, ok to be 15 minute visit.

## 2023-07-20 ENCOUNTER — Ambulatory Visit: Payer: Medicare Other | Admitting: Family Medicine

## 2023-07-20 ENCOUNTER — Encounter: Payer: Self-pay | Admitting: Family Medicine

## 2023-07-20 VITALS — BP 144/92 | HR 93 | Temp 97.9°F | Ht 69.5 in | Wt 170.5 lb

## 2023-07-20 DIAGNOSIS — I10 Essential (primary) hypertension: Secondary | ICD-10-CM

## 2023-07-20 DIAGNOSIS — I7121 Aneurysm of the ascending aorta, without rupture: Secondary | ICD-10-CM | POA: Diagnosis not present

## 2023-07-20 MED ORDER — AMLODIPINE BESYLATE 2.5 MG PO TABS: 2.5000 mg | ORAL_TABLET | Freq: Every day | ORAL | 1 refills | Status: DC

## 2023-07-20 NOTE — Progress Notes (Signed)
   Established Patient Office Visit  Subjective   Patient ID: Angela Daniels, female    DOB: 1946-08-01  Age: 77 y.o. MRN: [id number]  Chief Complaint  Patient presents with   Results    Pt is here to discuss her results of the CT calcium score. We discussed that her overall calcium level is only 8 which is low for her age group and gender. There was also an incidental finding of dilation and ectasia of the ascending aorta and the patient was concerned about this. Patient also brought In her home blood pressure readings. I reviewed these with her and there are some days when it is >140/90. We discussed how this can affect the ascending aorta and she is agreeable to going back on the amlodipine.     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     Objective:     BP (!) 144/92 (BP Location: Right Arm, Patient Position: Sitting, Cuff Size: Normal)   Pulse 93   Temp 97.9 F (36.6 C) (Oral)   Ht 5' 9.5" (1.765 m)   Wt 170 lb 8 oz (77.3 kg)   SpO2 99%   BMI 24.82 kg/m    Physical Exam Constitutional:      Appearance: Normal appearance. She is normal weight.  Pulmonary:     Effort: Pulmonary effort is normal.  Neurological:     Mental Status: She is alert and oriented to person, place, and time. Mental status is at baseline.  Psychiatric:        Mood and Affect: Mood normal.        Behavior: Behavior normal.      No  results found for any visits on 07/20/23.    The 10-year ASCVD risk score (Arnett DK, et al., 2019) is: 26%    Assessment & Plan:  Primary hypertension Assessment & Plan: BP is intermittently elevated at home, always elevated here. Pt is agreeable to going back on amlodipine 2.5 mg daily. Rx sent to pharmacy. Will see her back next month for BP repeat.   Orders: -     amLODIPine Besylate; Take 1 tablet (2.5 mg total) by mouth daily.  Dispense: 90 tablet; Refill: 1  Aneurysm of ascending aorta without rupture (HCC) Assessment & Plan: 4.3 cm seen on CT calcium test. Will monitor this yearly with CT chest. I spent 20 minutes discussing this with her.      No follow-ups on file.    Karie Georges, MD

## 2023-07-20 NOTE — Assessment & Plan Note (Signed)
4.3 cm seen on CT calcium test. Will monitor this yearly with CT chest. I spent 20 minutes discussing this with her.

## 2023-07-20 NOTE — Assessment & Plan Note (Signed)
BP is intermittently elevated at home, always elevated here. Pt is agreeable to going back on amlodipine 2.5 mg daily. Rx sent to pharmacy. Will see her back next month for BP repeat.

## 2023-09-05 ENCOUNTER — Encounter: Payer: Medicare Other | Admitting: Family Medicine

## 2023-09-09 ENCOUNTER — Ambulatory Visit: Payer: Medicare Other | Attending: Family Medicine

## 2023-09-09 ENCOUNTER — Ambulatory Visit (INDEPENDENT_AMBULATORY_CARE_PROVIDER_SITE_OTHER): Payer: Medicare Other | Admitting: Family Medicine

## 2023-09-09 VITALS — BP 120/78 | HR 90 | Temp 96.6°F | Ht 69.25 in | Wt 170.5 lb

## 2023-09-09 DIAGNOSIS — I10 Essential (primary) hypertension: Secondary | ICD-10-CM | POA: Diagnosis not present

## 2023-09-09 DIAGNOSIS — R0600 Dyspnea, unspecified: Secondary | ICD-10-CM

## 2023-09-09 DIAGNOSIS — R0789 Other chest pain: Secondary | ICD-10-CM

## 2023-09-09 DIAGNOSIS — R002 Palpitations: Secondary | ICD-10-CM | POA: Diagnosis not present

## 2023-09-09 NOTE — Assessment & Plan Note (Signed)
Chronic, stable, BP is well controlled today, she is doing well on the amlodipine 2.5 mg daily, will continue this as prescribed.

## 2023-09-09 NOTE — Progress Notes (Signed)
Established Patient Office Visit  Subjective   Patient ID: Angela Daniels, female    DOB: 1945-11-09  Age: 78 y.o. MRN: [id number]  Chief Complaint  Patient presents with   Annual Exam    Pt is here for follow up on her blood pressure. BP today is well controlled at 120/78. She reports that she is tolerating the medication well, is checking her blood pressure daily and doesn't think she is having any side effects-- states that she thinks that her hearing maybe isn't as good as before--  was worried that the hearing decrease might be related to   Pt is reporting random chest tightness, states that this has happened to her in the past, feels like there is a vice wrapping around her chest and around to her back. States that it is random, sometimes only lasts a few seconds, and sometimes it lasts the whole day. States that it is not associated with food/ eating, no heart burn, states that when it happens she tries to take deep breaths, sometimes will try to burp to relieve the sensation. States that it is happening almost daily.     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     Objective:     BP 120/78   Pulse 90   Temp (!) 96.6 F (35.9 C) (Axillary)   Ht 5' 9.25" (1.759 m)   Wt 170 lb 8 oz (77.3 kg)   SpO2 99%   BMI 25.00 kg/m    Physical Exam Vitals reviewed.  Constitutional:      Appearance: Normal appearance. She is normal weight.   Cardiovascular:     Rate and Rhythm: Normal rate and regular rhythm.     Heart sounds: Normal heart sounds. No murmur heard. Pulmonary:     Effort: Pulmonary effort is normal.  Musculoskeletal:     Right lower leg: No edema.     Left lower leg: No edema.  Neurological:     Mental Status: She is alert and oriented to person, place, and time. Mental status is at baseline.  Psychiatric:        Mood and Affect: Mood normal.        Behavior: Behavior normal.      No results found for any visits on 09/09/23.    The 10-year ASCVD risk score (Arnett DK, et al., 2019) is: 18.8%    Assessment & Plan:  Primary hypertension Assessment & Plan: Chronic, stable, BP is well controlled today, she is doing well on the amlodipine 2.5 mg daily, will continue this as prescribed.    Chest tightness Unclear etiology-- comes and goes, is random, CT calcium score was very low, pt is concerned about an arrythmia, will order 14 day holter monitor to rule out heart arrhythmias. She doesn't have any history of smoking or lung disease, but if this is normal then I will send to pulmonary for evaluation  -     LONG TERM  MONITOR (3-14 DAYS); Future  Dyspnea, unspecified type -     LONG TERM MONITOR (3-14 DAYS); Future  Heart palpitations -     LONG TERM MONITOR (3-14 DAYS); Future     Return in about 6 months (around 03/08/2024) for HTN.    Karie Georges, MD

## 2023-09-09 NOTE — Progress Notes (Unsigned)
EP to read.

## 2023-09-23 ENCOUNTER — Encounter: Payer: Self-pay | Admitting: Family Medicine

## 2023-10-05 DIAGNOSIS — R002 Palpitations: Secondary | ICD-10-CM

## 2023-10-05 DIAGNOSIS — R0789 Other chest pain: Secondary | ICD-10-CM | POA: Diagnosis not present

## 2023-10-05 DIAGNOSIS — R0600 Dyspnea, unspecified: Secondary | ICD-10-CM | POA: Diagnosis not present

## 2023-10-11 ENCOUNTER — Encounter: Payer: Self-pay | Admitting: Family Medicine

## 2023-10-11 DIAGNOSIS — I471 Supraventricular tachycardia, unspecified: Secondary | ICD-10-CM

## 2023-10-11 DIAGNOSIS — R0600 Dyspnea, unspecified: Secondary | ICD-10-CM

## 2023-10-11 DIAGNOSIS — R002 Palpitations: Secondary | ICD-10-CM

## 2023-10-13 ENCOUNTER — Other Ambulatory Visit: Payer: Self-pay

## 2023-10-13 ENCOUNTER — Encounter (HOSPITAL_BASED_OUTPATIENT_CLINIC_OR_DEPARTMENT_OTHER): Payer: Self-pay

## 2023-10-13 ENCOUNTER — Telehealth: Payer: Self-pay | Admitting: *Deleted

## 2023-10-13 ENCOUNTER — Emergency Department (HOSPITAL_BASED_OUTPATIENT_CLINIC_OR_DEPARTMENT_OTHER): Payer: Medicare Other | Admitting: Radiology

## 2023-10-13 DIAGNOSIS — Z91040 Latex allergy status: Secondary | ICD-10-CM | POA: Diagnosis not present

## 2023-10-13 DIAGNOSIS — R0602 Shortness of breath: Secondary | ICD-10-CM | POA: Diagnosis not present

## 2023-10-13 DIAGNOSIS — Z79899 Other long term (current) drug therapy: Secondary | ICD-10-CM | POA: Insufficient documentation

## 2023-10-13 DIAGNOSIS — R0789 Other chest pain: Secondary | ICD-10-CM | POA: Insufficient documentation

## 2023-10-13 LAB — BASIC METABOLIC PANEL
Anion gap: 9 (ref 5–15)
BUN: 15 mg/dL (ref 8–23)
CO2: 29 mmol/L (ref 22–32)
Calcium: 9.4 mg/dL (ref 8.9–10.3)
Chloride: 101 mmol/L (ref 98–111)
Creatinine, Ser: 0.74 mg/dL (ref 0.44–1.00)
GFR, Estimated: >60 mL/min (ref >60)
Glucose, Bld: 101 mg/dL — ABNORMAL HIGH (ref 70–99)
Potassium: 3.6 mmol/L (ref 3.5–5.1)
Sodium: 139 mmol/L (ref 135–145)

## 2023-10-13 LAB — CBC
HCT: 42.4 % (ref 36.0–46.0)
Hemoglobin: 14.5 g/dL (ref 12.0–15.0)
MCH: 31 pg (ref 26.0–34.0)
MCHC: 34.2 g/dL (ref 30.0–36.0)
MCV: 90.8 fL (ref 80.0–100.0)
Platelets: 244 10*3/uL (ref 150–400)
RBC: 4.67 MIL/uL (ref 3.87–5.11)
RDW: 12.2 % (ref 11.5–15.5)
WBC: 6.2 10*3/uL (ref 4.0–10.5)
nRBC: 0 % (ref 0.0–0.2)

## 2023-10-13 LAB — RESP PANEL BY RT-PCR (RSV, FLU A&B, COVID)  RVPGX2
Influenza A by PCR: NEGATIVE
Influenza B by PCR: NEGATIVE
Resp Syncytial Virus by PCR: NEGATIVE
SARS Coronavirus 2 by RT PCR: NEGATIVE

## 2023-10-13 LAB — TROPONIN I (HIGH SENSITIVITY): Troponin I (High Sensitivity): 3 ng/L (ref <18)

## 2023-10-13 MED ORDER — ALBUTEROL SULFATE HFA 108 (90 BASE) MCG/ACT IN AERS: 2.0000 | INHALATION_SPRAY | RESPIRATORY_TRACT | Status: DC | PRN

## 2023-10-13 NOTE — Telephone Encounter (Signed)
 Unfortunately there isn't anything I can do to get her in sooner, based on her heart monitor findings it should be safe to wait the 2 months without needing to be seen since there were so few episodes.  If she continues to have chest pressure and she is worried that it is her heart she should go to the ER for emergent care.

## 2023-10-13 NOTE — Telephone Encounter (Signed)
 Copied from CRM [id number]. Topic: Referral - Question >> Oct 13, 2023  2:11 PM Kathryne Eriksson wrote: Reason for CRM: Cardiologist >> Oct 13, 2023  2:16 PM Kathryne Eriksson wrote: Patient states she received a call from the referred cardiologist in regards to scheduling but they're stating they can't get her in until 2 months from now. Patient is wanting to know if there's anything Karie Georges, MD can do. Patient wants to know if she needs to be seen back in the office? She's just wanting some assurance as far as what she should do next since the pressure in her chest isn't getting any better and is affecting her breathing frequently.

## 2023-10-13 NOTE — Telephone Encounter (Signed)
 Patient informed of the message below.  Stated she feels pressure, shortness of breath when she goes for a walk, questioned if this was due to Amlodipine and "does not feel herself" and has noticed this more lately.  Patient was advised a number of times to go to the ER for treatment immediately.   I offered to call 911 if she was alone and she stated she has someone that can drive her.

## 2023-10-13 NOTE — ED Triage Notes (Signed)
 Presents from home c/o shortness of breath and chest/upper abd tightness that bands around and radiates to back ongoing x2 months intermittently. Walks frequently, increasing difficulty breathing w/ exertion x couple weeks.

## 2023-10-14 ENCOUNTER — Emergency Department (HOSPITAL_BASED_OUTPATIENT_CLINIC_OR_DEPARTMENT_OTHER)
Admission: EM | Admit: 2023-10-14 | Discharge: 2023-10-14 | Disposition: A | Discharge status: Home / Self Care | Payer: Medicare Other | Attending: Emergency Medicine | Admitting: Emergency Medicine

## 2023-10-14 DIAGNOSIS — R0789 Other chest pain: Secondary | ICD-10-CM | POA: Diagnosis not present

## 2023-10-14 LAB — TROPONIN I (HIGH SENSITIVITY): Troponin I (High Sensitivity): 4 ng/L (ref <18)

## 2023-10-14 NOTE — Discharge Instructions (Signed)
 Follow-up with cardiology.  A referral has been placed to the cardiology clinic on [facility].  They should call you to make these arrangements.  If you have not heard from them in the next 1 to 2 days, call to make these arrangements.  Their contact information has been provided in this discharge summary.  Return to the emergency department if you develop severe chest pain, worsening breathing, or for other new and concerning symptoms.

## 2023-10-14 NOTE — ED Provider Notes (Signed)
 [place] EMERGENCY DEPARTMENT AT [facility] Provider Note   CSN: [id number] Arrival date & time: 10/13/23  1847     History  Chief Complaint  Patient presents with   Shortness of Breath   Chest Pain    Angela Daniels is a 78 y.o. female.  For is a 78 year old female presenting with complaints of chest tightness and shortness of breath.  This has been occurring episodically for the past several weeks.  This happens sometimes with exertion and sometimes with rest.  She does describe excessive stress in her life with the recent passing of her husband and move from [place] to [place].  No leg swelling.  No cough.  No fevers or chills.  She has been seen by a primary doctor here in the area and tells me she was referred to cardiology, however an appointment is not available until May 1.  The history is provided by the patient.       Home Medications Prior to Admission medications   Medication Sig Start Date End Date Taking? Authorizing Provider  amLODipine (NORVASC) 2.5 MG tablet Take 1 tablet (2.5 mg total) by mouth daily. 07/20/23   Karie Georges, MD  denosumab (PROLIA) 60 MG/ML SOSY injection Inject 60 mg into the skin every 6 (six) months.    [provider]  loratadine (CLARITIN) 10 MG tablet Take 10 mg by mouth daily.    [provider]      Allergies    Shellfish allergy, Iodine, Latex, and Sulfa antibiotics    Review of Systems   Review of Systems  All other systems reviewed and are negative.   Physical Exam Updated Vital Signs BP (!) 143/82 (BP Location: Left Arm)   Pulse 77   Temp 98.1 F (36.7 C)   Resp 19   Ht 5\' 9"  (1.753 m)   Wt 77.1 kg   SpO2 98%   BMI 25.10 kg/m  Physical Exam Vitals and nursing note reviewed.  Constitutional:      General: She is not in acute distress.    Appearance: She is well-developed. She is not diaphoretic.  HENT:     Head: Normocephalic and atraumatic.  Cardiovascular:     Rate and  Rhythm: Normal rate and regular rhythm.     Heart sounds: No murmur heard.    No friction rub. No gallop.  Pulmonary:     Effort: Pulmonary effort is normal. No respiratory distress.     Breath sounds: Normal breath sounds. No wheezing.  Abdominal:     General: Bowel sounds are normal. There is no distension.     Palpations: Abdomen is soft.     Tenderness: There is no abdominal tenderness.  Musculoskeletal:        General: Normal range of motion.     Cervical back: Normal range of motion and neck supple.     Right lower leg: No tenderness. No edema.     Left lower leg: No tenderness. No edema.  Skin:    General: Skin is warm and dry.  Neurological:     General: No focal deficit present.     Mental Status: She is alert and oriented to person, place, and time.     ED Results / Procedures / Treatments   Labs (all labs ordered are listed, but only abnormal results are displayed) Labs Reviewed  BASIC METABOLIC PANEL - Abnormal; Notable for the following components:      Result Value   Glucose, Bld 101 (*)  All other components within normal limits  RESP PANEL BY RT-PCR (RSV, FLU A&B, COVID)  [id number]  CBC  TROPONIN I (HIGH SENSITIVITY)  TROPONIN I (HIGH SENSITIVITY)    EKG ED ECG REPORT   Date: 10/14/2023  Rate: 92  Rhythm: normal sinus rhythm  QRS Axis: right  Intervals: normal  ST/T Wave abnormalities: normal  Conduction Disutrbances:none  Narrative Interpretation:   Old EKG Reviewed: unchanged  I have personally reviewed the EKG tracing and agree with the computerized printout as noted.   Radiology DG Chest 2 View Result Date: 10/13/2023 CLINICAL DATA:  Shortness of breath for 2 months, initial encounter EXAM: CHEST - 2 VIEW COMPARISON:  02/26/2020 FINDINGS: Cardiac shadow is stable. The lungs are well aerated bilaterally. Mild apical pleural and parenchymal scarring is seen. No focal infiltrate or effusion is noted. No bony abnormality is seen. IMPRESSION: No  active cardiopulmonary disease. Electronically Signed   By: Alcide Clever M.D.   On: 10/13/2023 21:39    Procedures Procedures    Medications Ordered in ED Medications  albuterol (VENTOLIN HFA) 108 (90 Base) MCG/ACT inhaler 2 puff (has no administration in time range)    ED Course/ Medical Decision Making/ A&P  Patient presenting here with chest tightness and shortness of breath as described in the HPI.  Patient arrives with stable vital signs and is afebrile.  Physical examination is unremarkable.  Laboratory studies obtained including CBC, metabolic panel, and troponin x 2, all of which are unremarkable.  Chest x-ray is clear.  Because of these episodes unclear, but feel as though patient may benefit from a stress test.  She tells me that she did have 1 in 2021 that was performed in [place], but I am unable to see the results.  She was referred to cardiology, however tells me an appointment is not available until May 1.  I will place a consult from the ER and see if this will expedite an appointment for her, but see no indication for admission this evening.  Patient to be discharged with rest and outpatient follow-up.  Final Clinical Impression(s) / ED Diagnoses Final diagnoses:  None    Rx / DC Orders ED Discharge Orders     None         Geoffery Lyons, MD 10/14/23 [phone]

## 2023-10-17 ENCOUNTER — Encounter: Payer: Self-pay | Admitting: Cardiovascular Disease

## 2023-10-17 ENCOUNTER — Ambulatory Visit: Payer: Medicare Other | Attending: Cardiovascular Disease | Admitting: Cardiovascular Disease

## 2023-10-17 VITALS — BP 132/86 | HR 97 | Ht 69.0 in | Wt 174.4 lb

## 2023-10-17 DIAGNOSIS — I471 Supraventricular tachycardia, unspecified: Secondary | ICD-10-CM | POA: Diagnosis not present

## 2023-10-17 DIAGNOSIS — R0602 Shortness of breath: Secondary | ICD-10-CM | POA: Diagnosis present

## 2023-10-17 DIAGNOSIS — I712 Thoracic aortic aneurysm, without rupture, unspecified: Secondary | ICD-10-CM | POA: Insufficient documentation

## 2023-10-17 DIAGNOSIS — R072 Precordial pain: Secondary | ICD-10-CM | POA: Diagnosis present

## 2023-10-17 MED ORDER — DIPHENHYDRAMINE HCL 50 MG PO CAPS: ORAL_CAPSULE | ORAL | 0 refills | Status: DC

## 2023-10-17 MED ORDER — METOPROLOL TARTRATE 100 MG PO TABS: 100.0000 mg | ORAL_TABLET | Freq: Once | ORAL | 0 refills | Status: DC

## 2023-10-17 MED ORDER — PREDNISONE 50 MG PO TABS: ORAL_TABLET | ORAL | 0 refills | Status: DC

## 2023-10-17 MED ORDER — DILTIAZEM HCL ER COATED BEADS 120 MG PO CP24: 120.0000 mg | ORAL_CAPSULE | Freq: Every day | ORAL | 3 refills | Status: DC

## 2023-10-17 NOTE — Progress Notes (Signed)
 Chief Complaint  Patient presents with   Palpitations   New Patient (Initial Visit)    Palpitations, chest pain, dyspnea    History of Present Illness: 78 yo female with history of HTN who is here today as a new consult, referred by Dr. Casimiro Needle, for evaluation of palpitations, chest pain and dyspnea. She moved to [place] from [place] in July 2024. CT coronary calcium score of 8 in November 2024. This CT scan showed slight enlargement of the ascending aorta. She had chest heaviness at rest in January 2024. Cardiac monitor in February 2025 with sinus rhythm, 4 beat run of VT, SVT with longest episode lasting 13 beats. Rare PACs and rare PVCs. Symptoms associated with sinus rhythm. She was seen in the ED at [facility] 10/14/23 with normal chest x-ray. Negative troponin. EKG with sinus with non-specific ST abnormality. She has been very active. Her chest pressure occurs at rest and with exertion.   Primary Care Physician: Karie Georges, MD   Past Medical History:  Diagnosis Date   Allergy    Arthritis    Hypertension    Osteoporosis    Positive TB test    Thoracic aortic aneurysm [facility])     Past Surgical History:  Procedure Laterality Date   TONSILLECTOMY     1954    Current Outpatient Medications  Medication Sig Dispense Refill   cholecalciferol (VITAMIN D3) 25 MCG (1000 UNIT) tablet Take by mouth daily.     denosumab (PROLIA) 60 MG/ML SOSY injection Inject 60 mg into the skin every 6 (six) months.     diltiazem (CARDIZEM CD) 120 MG 24 hr capsule Take 1 capsule (120 mg total) by mouth daily. 90 capsule 3   diphenhydrAMINE (BENADRYL) 50 MG capsule Take one capsule 1 hour prior to scan. 1 capsule 0   loratadine (CLARITIN) 10 MG tablet Take 10 mg by mouth daily.     metoprolol tartrate (LOPRESSOR) 100 MG tablet Take 1 tablet (100 mg total) by mouth once for 1 dose. Take 90-120 minutes prior to scan. 1 tablet 0   predniSONE (DELTASONE) 50 MG tablet Take one tablet 13 hours, 7 hours,  and 1 hour prior to scan. 3 tablet 0   No current facility-administered medications for this visit.    Allergies  Allergen Reactions   Shellfish Allergy Shortness Of Breath   Iodine     Severe burning sensation   Latex     Cannot tolerate for long periods of time.   Sulfa Antibiotics     Social History   Socioeconomic History   Marital status: Widowed    Spouse name: Not on file   Number of children: 2   Years of education: Not on file   Highest education level: Bachelor's degree (e.g., BA, AB, BS)  Occupational History   Occupation: Retired Runner, broadcasting/film/video  Tobacco Use   Smoking status: Never   Smokeless tobacco: Never  Vaping Use   Vaping status: Never Used  Substance and Sexual Activity   Alcohol use: Yes    Comment: occasionally   Drug use: Never   Sexual activity: Not Currently  Other Topics Concern   Not on file  Social History Narrative   Not on file   Social Drivers of Health   Financial Resource Strain: Low Risk  (09/09/2023)   Overall Financial Resource Strain (CARDIA)    Difficulty of Paying Living Expenses: Not hard at all  Food Insecurity: No Food Insecurity (09/09/2023)   Hunger Vital Sign  Worried About Programme researcher, broadcasting/film/video in the Last Year: Never true    Ran Out of Food in the Last Year: Never true  Transportation Needs: No Transportation Needs (09/09/2023)   PRAPARE - Administrator, Civil Service (Medical): No    Lack of Transportation (Non-Medical): No  Physical Activity: Insufficiently Active (09/09/2023)   Exercise Vital Sign    Days of Exercise per Week: 3 days    Minutes of Exercise per Session: 40 min  Stress: Patient Declined (09/09/2023)   [employer] of Occupational Health - Occupational Stress Questionnaire    Feeling of Stress : Patient declined  Social Connections: Unknown (09/09/2023)   Social Connection and Isolation Panel [NHANES]    Frequency of Communication with Friends and Family: More than three times a week     Frequency of Social Gatherings with Friends and Family: Twice a week    Attends Religious Services: Patient declined    Database administrator or Organizations: No    Attends Engineer, structural: Not on file    Marital Status: Widowed  Intimate Partner Violence: Not At Risk (09/09/2023)   Humiliation, Afraid, Rape, and Kick questionnaire    Fear of Current or Ex-Partner: No    Emotionally Abused: No    Physically Abused: No    Sexually Abused: No    Family History  Problem Relation Age of Onset   Hyperlipidemia Mother    Hypertension Mother    Hearing loss Mother    Kidney disease Father    Cancer Father        Bladder cancer   Hypertension Sister    Hypertension Brother    Cancer Brother     Review of Systems:  As stated in the HPI and otherwise negative.   BP 132/86   Pulse 97   Ht 5\' 9"  (1.753 m)   Wt 79.1 kg   SpO2 99%   BMI 25.75 kg/m   Physical Examination: General: Well developed, well nourished, NAD  HEENT: OP clear, mucus membranes moist  SKIN: warm, dry. No rashes. Neuro: No focal deficits  Musculoskeletal: Muscle strength 5/5 all ext  Psychiatric: Mood and affect normal  Neck: No JVD, no carotid bruits, no thyromegaly, no lymphadenopathy.  Lungs:Clear bilaterally, no wheezes, rhonci, crackles Cardiovascular: Regular rate and rhythm. No murmurs, gallops or rubs. Abdomen:Soft. Bowel sounds present. Non-tender.  Extremities: No lower extremity edema. Pulses are 2 + in the bilateral DP/PT.  EKG:  EKG is not ordered today. The ekg ordered today demonstrates   Recent Labs: 06/23/2023: ALT 14 10/13/2023: BUN 15; Creatinine, Ser 0.74; Hemoglobin 14.5; Platelets 244; Potassium 3.6; Sodium 139   Lipid Panel    Component Value Date/Time   CHOL 201 (H) 06/23/2023 1131   TRIG 73.0 06/23/2023 1131   HDL 63.20 06/23/2023 1131   CHOLHDL 3 06/23/2023 1131   VLDL 14.6 06/23/2023 1131   LDLCALC 123 (H) 06/23/2023 1131     Wt Readings from Last 3  Encounters:  10/17/23 79.1 kg  10/13/23 77.1 kg  09/09/23 77.3 kg    Assessment and Plan:   1. Chest pain/Dyspnea: Risk factors for CAD include age and HTN. Will arrange coronary CTA to exclude obstructive CAD. (Pre treat for contrast allergy). Echo to assess LV size/function and exclude structural heart disease.   2. SVT: Will change Norvasc to Cardizem CD 120 mg daily.   3. Thoracic aortic aneurysm: Mild dilation vs ectasia on non-contrast CT in 2024. Will evaluate  with CTA now.   Labs/ tests ordered today include:   Orders Placed This Encounter  Procedures   CT CORONARY MORPH W/CTA COR W/SCORE W/CA W/CM &/OR WO/CM   ECHOCARDIOGRAM COMPLETE   Disposition:   F/U with me in 6-8 weeks.    Signed, Verne Carrow, MD, [facility] 10/17/2023 4:48 PM    [facility] [address] [place], [place], [place]  [address] Phone: [phone]; Fax: [phone]

## 2023-10-17 NOTE — Patient Instructions (Addendum)
 Medication Instructions:  Your physician has recommended you make the following change in your medication:  1.) stop amlodipine (Norvasc) 2.) start diltiazem (Cardizem CD) 120 mg - take one tablet daily  *If you need a refill on your cardiac medications before your next appointment, please call your pharmacy*   Lab Work: None today   Testing/Procedures: Your physician has requested that you have an echocardiogram. Echocardiography is a painless test that uses sound waves to create images of your heart. It provides your doctor with information about the size and shape of your heart and how well your heart's chambers and valves are working. This procedure takes approximately one hour. There are no restrictions for this procedure. Please do NOT wear cologne, perfume, aftershave, or lotions (deodorant is allowed). Please arrive 15 minutes prior to your appointment time.  Please note: We ask at that you not bring children with you during ultrasound (echo/ vascular) testing. Due to room size and safety concerns, children are not allowed in the ultrasound rooms during exams. Our front office staff cannot provide observation of children in our lobby area while testing is being conducted. An adult accompanying a patient to their appointment will only be allowed in the ultrasound room at the discretion of the ultrasound technician under special circumstances. We apologize for any inconvenience.  Your physician has requested that you have cardiac CT. Cardiac computed tomography (CT) is a painless test that uses an x-ray machine to take clear, detailed pictures of your heart. For further information please visit [email or web address] Please follow instruction sheet as given.    Follow-Up: At [facility], you and your health needs are our priority.  As part of our continuing mission to provide you with exceptional heart care, we have created designated Provider Care Teams.  These Care Teams  include your primary Cardiologist (physician) and Advanced Practice Providers (APPs -  Physician Assistants and Nurse Practitioners) who all work together to provide you with the care you need, when you need it.   Your next appointment:   6 weeks  Provider:   Verne Carrow, MD  or Advanced Practice Provider (NP or PA-C)    Your cardiac CT will be scheduled at  [facility] [address] [place], [place] [address] [phone]  Please arrive at the [facility] Entrance (Entrance C2) of [facility] 30 minutes prior to test start time. You can use the FREE valet parking offered at entrance C (encouraged to control the heart rate for the test)  Proceed to the [facility] Radiology Department (first floor) to check-in and test prep.  All radiology patients and guests should use entrance C2 at [facility], accessed from [facility], even though the hospital's physical address listed is [address].    Please follow these instructions carefully (unless otherwise directed):  An IV will be required for this test and Nitroglycerin will be given.   On the Night Before the Test: Be sure to Drink plenty of water. Do not consume any caffeinated/decaffeinated beverages or chocolate 12 hours prior to your test. Do not take any antihistamines 12 hours prior to your test. If the patient has contrast allergy: Patient will need a prescription for Prednisone and very clear instructions (as follows): Prednisone 50 mg - take 13 hours prior to test Take another Prednisone 50 mg 7 hours prior to test Take another Prednisone 50 mg 1 hour prior to test Take Benadryl 50 mg 1 hour prior to  test Patient must complete all four doses of above prophylactic medications. Patient will need a ride after test due to Benadryl.  On the Day of the Test: Drink plenty of water until 1 hour prior to the test. Do not eat any food 1 hour prior to  test. You may take your regular medications prior to the test.  Take metoprolol (Lopressor) two hours prior to test. FEMALES- please wear underwire-free bra if available, avoid dresses & tight clothing      After the Test: Drink plenty of water. After receiving IV contrast, you may experience a mild flushed feeling. This is normal. On occasion, you may experience a mild rash up to 24 hours after the test. This is not dangerous. If this occurs, you can take Benadryl 25 mg, Zyrtec, Claritin, or Allegra and increase your fluid intake. (Patients taking Tikosyn should avoid Benadryl, and may take Zyrtec, Claritin, or Allegra) If you experience trouble breathing, this can be serious. If it is severe call 911 IMMEDIATELY. If it is mild, please call our office.  We will call to schedule your test 2-4 weeks out understanding that some insurance companies will need an authorization prior to the service being performed.   For more information and frequently asked questions, please visit our website : [email or web address]  For non-scheduling related questions, please contact the cardiac imaging nurse navigator should you have any questions/concerns: Cardiac Imaging Nurse Navigators Direct Office Dial: [phone]   For scheduling needs, including cancellations and rescheduling, please call [place], [phone].

## 2023-10-24 ENCOUNTER — Telehealth: Payer: Self-pay | Admitting: Family Medicine

## 2023-10-24 NOTE — Telephone Encounter (Signed)
 Copied from CRM [id number]. Topic: Appointments - Appointment Scheduling >> Oct 24, 2023 11:35 AM Almira Coaster wrote: Patient/patient representative is calling to schedule an appointment. Patient is calling to schedule her Prolia injection due in May. Last Prolia injection was on 07/04/2023.

## 2023-11-08 ENCOUNTER — Telehealth (HOSPITAL_COMMUNITY): Payer: Self-pay | Admitting: *Deleted

## 2023-11-08 NOTE — Telephone Encounter (Signed)
 Reaching out to patient to offer assistance regarding upcoming cardiac imaging study; pt verbalizes understanding of appt date/time, parking situation and where to check in, pre-test NPO status and medications ordered, and verified current allergies; name and call back number provided for further questions should they arise Johney Frame RN Navigator Cardiac Imaging [facility] [phone] office [phone] cell  Patient verbalized understanding of allergy prep.

## 2023-11-09 ENCOUNTER — Ambulatory Visit (HOSPITAL_COMMUNITY)
Admission: RE | Admit: 2023-11-09 | Discharge: 2023-11-09 | Disposition: A | Discharge status: Home / Self Care | Source: Ambulatory Visit | Attending: Cardiovascular Disease | Admitting: Cardiovascular Disease

## 2023-11-09 DIAGNOSIS — R072 Precordial pain: Secondary | ICD-10-CM | POA: Diagnosis not present

## 2023-11-09 MED ORDER — NITROGLYCERIN 0.4 MG SL SUBL
0.8000 mg | SUBLINGUAL_TABLET | Freq: Once | SUBLINGUAL | Status: AC
  Administered 2023-11-09: 0.8 mg via SUBLINGUAL

## 2023-11-09 MED ORDER — IOHEXOL 350 MG/ML SOLN
95.0000 mL | Freq: Once | INTRAVENOUS | Status: AC | PRN
Start: 2023-11-09 — End: 2023-11-09
  Administered 2023-11-09: 95 mL via INTRAVENOUS

## 2023-11-09 MED ORDER — NITROGLYCERIN 0.4 MG SL SUBL
SUBLINGUAL_TABLET | SUBLINGUAL | Status: AC
  Filled 2023-11-09: qty 2

## 2023-11-10 ENCOUNTER — Ambulatory Visit (HOSPITAL_COMMUNITY): Attending: Cardiology

## 2023-11-10 DIAGNOSIS — R0602 Shortness of breath: Secondary | ICD-10-CM | POA: Diagnosis not present

## 2023-11-10 LAB — ECHOCARDIOGRAM COMPLETE
Area-P 1/2: 2.93 cm2
S' Lateral: 2.6 cm

## 2023-12-02 ENCOUNTER — Ambulatory Visit: Admitting: Cardiology

## 2023-12-04 NOTE — Progress Notes (Signed)
 Cardiology Office Note:    Date:  12/13/2023   ID:  Angela Daniels, DOB 12-03-1945, MRN [id number]  PCP:  Aida House, MD  Cardiologist:  Antoinette Batman, MD     Referring MD: Aida House, MD   Chief Complaint: follow-up of chest pain and shortness of breath  History of Present Illness:    Angela Daniels is a 78 y.o. female with a history of minimal CAD on recent coronary CTA in 10/2023, palpitations with short runs of SVT noted on monitor in 08/2023, ascending thoracic aortic aneurysm, mild mitral regurgitation, moderate tricuspid regurgitation, hypertension, hyperlipidemia, and arthritis who is followed by Dr. Abel Hoe and presents today for follow-up of of chest pain and shortness of breath.   Patient moved from [place]  to [facility] in 02/2023. She was recently referred to Dr. Abel Hoe in 10/2023 after a recent ED visit for chest pain. Work-up in the ED was unremarkable. Prior coronary calcium  score in 06/2023 was 8 (32nd percentile for age and sex). At initial visit, she reported chest pressure at rest and with exertion as well as palpitations and dyspnea. Recent cardiac monitor in 08/2023 showed normal sinus rhythm with one 4 beat run of NSVT, a couple of short runs of SVT (longest run 13 beats), and rare PACs/ PVCs but no significant arrhythmias. Amlodipine  was stopped and she was switched to Cardizem  CD instead. Coronary CTA and Echo were ordered for further evaluation. Coronary CTA showed a coronary calcium  score of 12.4 (34th percentile for age and sex), total plaque volume of 80 mm3 (24th percentile for age and sex), and only minimal plaque in the LAD and Diag but no obstructive CAD. 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 Echo showed LVEF of 60-65% with normal wall motion and diastolic parameters, normal RV function, mild biatrial enlargement, mild MR, moderate TR, and a small pericardial  effusion.  Patient presents today for follow-up. We reviewed her recent coronary CTA and Echo in great detail. She is doing better since last visit. She feels like her symptoms are "abating." She has not had any chest pain/ pressure in a while. She went for a walk for the first time in a while yesterday and had no chest/ back pain with this. She did have some shortness of breath with the walk but states it did not feel like the shortness of breath she was having before - she states it just felt like she was out of shape. Her energy level is improving as well. No orthopnea or PND. She reports a "tiny bit of fluid retention" around her ankles since switching to Cardizem  but nothing significant. No palpitations, lightheadedness, dizziness, or syncope. Overall, she is doing much better.   EKGs/Labs/Other Studies Reviewed:    The following studies were reviewed:  Monitor 09/13/2023 to 09/27/2023: NSR with sinus brady (46/min) and sinus tachy (194/min), ave 71/min. One episodes of NSVT, 4 beats at 194/min. NS SVT, 6 beats at 190 and 13 beats at 100/min. Rare PVC's and PAC's (<1%) No atrial fib or sustained VT or SVT. Symptoms associated with NSR _______________  Coronary CTA 11/09/2023: Impressions: 1. Coronary calcium  score of 12.4. This was 34 percentile for age-, sex, and race-matched controls. 2. Total plaque volume 80 mm3 which is 24 percentile for age- and sex-matched controls (calcified plaque 3 mm3; non-calcified plaque 77 mm3). TPV is (mild). 3. Normal coronary origin with right dominance. 4. Minimal plaque in the LAD  and diagonal. 5. Mildly dilated aortic root (3.8 cm); mild aortic atherosclerosis. _______________  Echocardiogram 11/10/2023: Impressions:  1. Left ventricular ejection fraction, by estimation, is 60 to 65%. The  left ventricle has normal function. The left ventricle has no regional  wall motion abnormalities. Left ventricular diastolic parameters were  normal.   2.  Right ventricular systolic function is normal. The right ventricular  size is normal. There is normal pulmonary artery systolic pressure.   3. Left atrial size was mildly dilated.   4. Right atrial size was mildly dilated.   5. A small pericardial effusion is present. There is no evidence of  cardiac tamponade.   6. The mitral valve is normal in structure. Mild mitral valve  regurgitation. No evidence of mitral stenosis.   7. Tricuspid valve regurgitation is moderate.   8. The aortic valve is tricuspid. Aortic valve regurgitation is trivial.  No aortic stenosis is present.   9. The inferior vena cava is normal in size with greater than 50%  respiratory variability, suggesting right atrial pressure of 3 mmHg.   Comparison(s): No prior Echocardiogram.   Conclusion(s)/Recommendation(s): Otherwise normal echocardiogram, with  minor abnormalities described in the report.    EKG:  EKG not ordered today.   Recent Labs: 06/23/2023: ALT 14 10/13/2023: BUN 15; Creatinine, Ser 0.74; Hemoglobin 14.5; Platelets 244; Potassium 3.6; Sodium 139  Recent Lipid Panel    Component Value Date/Time   CHOL 201 (H) 06/23/2023 1131   TRIG 73.0 06/23/2023 1131   HDL 63.20 06/23/2023 1131   CHOLHDL 3 06/23/2023 1131   VLDL 14.6 06/23/2023 1131   LDLCALC 123 (H) 06/23/2023 1131    Physical Exam:    Vital Signs: BP 126/70   Pulse 93   Ht 5\' 10"  (1.778 m)   Wt 175 lb (79.4 kg)   SpO2 99%   BMI 25.11 kg/m     Wt Readings from Last 3 Encounters:  12/13/23 175 lb (79.4 kg)  10/17/23 174 lb 6.4 oz (79.1 kg)  10/13/23 170 lb (77.1 kg)     General: 77 y.o. Caucasian female in no acute distress. HEENT: Normocephalic and atraumatic. Sclera clear.  Neck: Supple. No carotid bruits. No JVD. Heart: RRR. Distinct S1 and S2. No murmurs, gallops, or rubs.  Lungs: No increased work of breathing. Clear to ausculation bilaterally. No wheezes, rhonchi, or rales.  Extremities: No lower extremity edema.  Skin:  Warm and dry. Neuro: No focal deficits. Psych: Normal affect. Responds appropriately.   Assessment:    1. Non-obstructive CAD   2. Palpitations   3. Paroxysmal SVT (supraventricular tachycardia) (HCC)   4. Aneurysm of ascending aorta without rupture (HCC)   5. Mild mitral regurgitation   6. Moderate tricuspid regurgitation   7. Primary hypertension   8. Hyperlipidemia, unspecified hyperlipidemia type     Plan:    Minimal Non-Obstructive CAD Patient reported chest pressure and dyspnea at last visit in 10/2023. Coronary CTA showed a coronary calcium  score of 12.4 (34th percentile for age and sex), total plaque volume of 80 mm3 (24th percentile for age and sex), and only minimal plaque in the LAD and Diag but no obstructive CAD. Echo showed normal LV function.  - Feeling much better. Symptoms have essentially resolved. - Can hold off on starting Aspirin given only minimal CAD. - LDL was 123 in 06/2023 so would recommend starting a statin. Will start Crestor  10mg  daily.   Paroxysmal SVT Patient has a history of palpitations. Recent monitor in 08/2023 showed  normal sinus rhythm with one 4 beat run of NSVT, a couple of short runs of SVT (longest run 13 beats), and rare PACs/ PVCs but no significant arrhythmias. - Stable. No recent palpitations. - Continue Cardizem  CD 120mg  daily. - Discussed switching from Cardizem  to a beta-blocker given aneurysm. However, she would like to remain on Cardizem  for now given beta-blockers can cause fatigue.   Ascending Thoracic Aortic Aneurysm Recent coronary CTA in 10/2023 showed a mildly dilated aortic root measuring 3.8 cm and a 4.1 cm ascending thoracic aorta aneurysm.  - Can repeat chest CTA in 10/2024. Of note, she does have a contrast allergy. Offered a chest MRA instead but patient would prefer to do the CTA. She states she did fine with recent CTA after being premedicated. Therefore, she will need to be premedicated. We can order this closer to time of  study. Asked patient to call our office or send us  a MyChart message when she gets called to schedule her CTA so that we can order premedication.   Mild Mitral Regurgitation Moderate Tricuspid Regurgitation Noted on recent Echo in 10/2023.  - Can continue routine monitoring with serial Echos.   Hypertension BP well controlled.  - Continue Cardizem  CD 120mg  daily.   Hyperlipidemia Lipid panel in 06/2023: Total Cholesterol 201, Triglycerides 73, HDL 63.2, LDL 123. LDL goal <70 given CAD.  - Will start Crestor  10mg  daily.  - Will need lipid panel and LFTs in 2-3 months.  Disposition: Follow up in 1 year.   Signed, Casimer Clear, PA-C  12/13/2023 1:40 PM    [facility]

## 2023-12-05 ENCOUNTER — Telehealth: Payer: Self-pay

## 2023-12-05 NOTE — Telephone Encounter (Signed)
 Pt ready for scheduling 01/01/24  Estimated out-of-pocket cost due at time of visit: $0  Primary Insurance: Medicare  This summary of benefits is an estimation of the patient's out-of-pocket cost. Exact cost may vary based on individual plan coverage.

## 2023-12-06 NOTE — Telephone Encounter (Signed)
 Patient informed of the message below and stated she already has an appt on 5/19.

## 2023-12-13 ENCOUNTER — Ambulatory Visit: Attending: Student | Admitting: Student

## 2023-12-13 ENCOUNTER — Encounter: Payer: Self-pay | Admitting: Student

## 2023-12-13 VITALS — BP 126/70 | HR 93 | Ht 70.0 in | Wt 175.0 lb

## 2023-12-13 DIAGNOSIS — I2510 Atherosclerotic heart disease of native coronary artery without angina pectoris: Secondary | ICD-10-CM

## 2023-12-13 DIAGNOSIS — I7121 Aneurysm of the ascending aorta, without rupture: Secondary | ICD-10-CM | POA: Diagnosis present

## 2023-12-13 DIAGNOSIS — I471 Supraventricular tachycardia, unspecified: Secondary | ICD-10-CM

## 2023-12-13 DIAGNOSIS — I071 Rheumatic tricuspid insufficiency: Secondary | ICD-10-CM

## 2023-12-13 DIAGNOSIS — I10 Essential (primary) hypertension: Secondary | ICD-10-CM

## 2023-12-13 DIAGNOSIS — E785 Hyperlipidemia, unspecified: Secondary | ICD-10-CM | POA: Diagnosis present

## 2023-12-13 DIAGNOSIS — I340 Nonrheumatic mitral (valve) insufficiency: Secondary | ICD-10-CM

## 2023-12-13 DIAGNOSIS — R002 Palpitations: Secondary | ICD-10-CM | POA: Diagnosis not present

## 2023-12-13 MED ORDER — ROSUVASTATIN CALCIUM 10 MG PO TABS: 10.0000 mg | ORAL_TABLET | Freq: Every day | ORAL | 3 refills | Status: DC

## 2023-12-13 NOTE — Patient Instructions (Signed)
 Medication Instructions:  START ROSUVASTATIN 10MG  DAILY *If you need a refill on your cardiac medications before your next appointment, please call your pharmacy*  Lab Work: FASTING LIPID AND LFT IN 2-3 MONTHS If you have labs (blood work) drawn today and your tests are completely normal, you will receive your results only by:  MyChart Message (if you have MyChart) OR A paper copy in the mail If you have any lab test that is abnormal or we need to change your treatment, we will call you to review the results.  Testing/Procedures: YOUR PROVIDER WOULD LIKE FOR YOU TO HAVE A CTA IN MARCH 2026-CALL FOR RX'S ONCE SCHEDULED  Follow-Up: At [facility], you and your health needs are our priority.  As part of our continuing mission to provide you with exceptional heart care, our providers are all part of one team.  This team includes your primary Cardiologist (physician) and Advanced Practice Providers or APPs (Physician Assistants and Nurse Practitioners) who all work together to provide you with the care you need, when you need it.  Your next appointment:   12 month(s)-MAKE SURE THIS IS AFTER SCHEDULED CT  Provider:   Antoinette Batman, MD      Your cardiac CT will be scheduled at one of the below locations:   [facility] [address] [place], [place] [address] [phone]  OR  [facility] [address] [place], [place] [address] [phone]  OR   [facility] [address] [place], [place] [address] [phone]  OR   [facility] [address] [place], [place] [address] [phone]  OR   [facility] and Vascular Tower [address]  [place], [place] [address] Opening December 12, 2023  If scheduled at [facility], please arrive at the [facility] and Children's Entrance (Entrance C2) of [facility] 30 minutes prior to test start  time. You can use the FREE valet parking offered at entrance C (encouraged to control the heart rate for the test)  Proceed to the [facility] Radiology Department (first floor) to check-in and test prep.   All radiology patients and guests should use entrance C2 at [facility], accessed from [facility], even though the hospital's physical address listed is [address]    If scheduled at the Heart and Vascular Tower at [address], please enter the parking lot using the [address] entrance and use the FREE valet service at the patient drop-off area. Enter the buidling and check-in with registration on the main floor.  If scheduled at [facility] or [facility], please arrive 15 mins early for check-in and test prep.  There is spacious parking and easy access to the radiology department from the [facility] Heart and Vascular entrance. Please enter here and check-in with the desk attendant.   If scheduled at [facility], please arrive 30 minutes early for check-in and test prep.  Please follow these instructions carefully (unless otherwise directed):  An IV will be required for this test and Nitroglycerin  will be given.  Hold all erectile dysfunction medications at least 3 days (72 hrs) prior to test. (Ie viagra, cialis, sildenafil, tadalafil, etc)   On the Night Before the Test: Be sure to Drink plenty of water. Do not consume any caffeinated/decaffeinated beverages or chocolate 12 hours prior to your test. Do not take any antihistamines 12 hours  prior to your test. The patient has contrast allergy: Patient will need a prescription for Prednisone  and very clear instructions (as follows): Prednisone  50 mg - take 13 hours prior to test Take another Prednisone  50 mg 7 hours prior to test Take another Prednisone  50 mg 1 hour prior to test Take Benadryl  50 mg 1 hour prior to test Patient must complete  all four doses of above prophylactic medications. Patient will need a ride after test due to Benadryl .  On the Day of the Test: Drink plenty of water until 1 hour prior to the test. Do not eat any food 1 hour prior to test. You may take your regular medications prior to the test.  Take metoprolol  (Lopressor ) two hours prior to test. If you take Furosemide/Hydrochlorothiazide/Spironolactone/Chlorthalidone, please HOLD on the morning of the test. Patients who wear a continuous glucose monitor MUST remove the device prior to scanning. FEMALES- please wear underwire-free bra if available, avoid dresses & tight clothing      After the Test: Drink plenty of water. After receiving IV contrast, you may experience a mild flushed feeling. This is normal. On occasion, you may experience a mild rash up to 24 hours after the test. This is not dangerous. If this occurs, you can take Benadryl  25 mg, Zyrtec, Claritin, or Allegra and increase your fluid intake. (Patients taking Tikosyn should avoid Benadryl , and may take Zyrtec, Claritin, or Allegra) If you experience trouble breathing, this can be serious. If it is severe call 911 IMMEDIATELY. If it is mild, please call our office.  We will call to schedule your test 2-4 weeks out understanding that some insurance companies will need an authorization prior to the service being performed.   For more information and frequently asked questions, please visit our website : [email or web address]  For non-scheduling related questions, please contact the cardiac imaging nurse navigator should you have any questions/concerns: Cardiac Imaging Nurse Navigators Direct Office Dial: [phone]   For scheduling needs, including cancellations and rescheduling, please call [place], [phone].

## 2023-12-15 ENCOUNTER — Ambulatory Visit: Payer: Medicare Other | Admitting: Cardiology

## 2024-01-02 ENCOUNTER — Ambulatory Visit: Admitting: *Deleted

## 2024-01-02 DIAGNOSIS — M810 Age-related osteoporosis without current pathological fracture: Secondary | ICD-10-CM

## 2024-01-02 MED ORDER — DENOSUMAB 60 MG/ML ~~LOC~~ SOSY: 60.0000 mg | PREFILLED_SYRINGE | Freq: Once | SUBCUTANEOUS | Status: DC

## 2024-01-02 MED ORDER — DENOSUMAB 60 MG/ML ~~LOC~~ SOSY
60.0000 mg | PREFILLED_SYRINGE | Freq: Once | SUBCUTANEOUS | Status: AC
  Administered 2024-01-02: 60 mg via SUBCUTANEOUS

## 2024-01-02 NOTE — Progress Notes (Signed)
Per orders of Dr. Casimiro Needle, injection of Prolia 60mg  given by Johnella Moloney. Patient tolerated injection well.

## 2024-02-08 ENCOUNTER — Telehealth: Payer: Self-pay

## 2024-02-08 NOTE — Telephone Encounter (Signed)
 Copied from CRM [id number]. Topic: Clinical - Medical Advice >> Feb 08, 2024  4:29 PM Angela Daniels wrote: Reason for CRM: Patient is calling because she got a notification she may have been exposed to measles. Patient would like to know how she could go about being tested for this to see if she has antibodies.

## 2024-02-08 NOTE — Telephone Encounter (Signed)
 We could order measles titers to see if she is immune, but I would wait to see what the health department advises her to do, ok to close

## 2024-02-08 NOTE — Telephone Encounter (Signed)
 Spoke with Tammy, nurse at the [facility] Department [phone]) to see what is recommended as below.  Tammy stated if the patient was born prior to [birth year] they are considered to be immune.  She stated if the patient is severely immunocompromised, a post exposure shot is recommended.  She also questioned when and where the patient may have been exposed.  I called the patient and informed her of this.  She stated she is not from the [place] , was born in [place] and her immunization records are [place].  Patient stated she received this message as she was at the [facility] on Friday.  Patient was advised to contact Tammy at the number above for further instructions.

## 2024-02-09 ENCOUNTER — Telehealth: Payer: Self-pay | Admitting: *Deleted

## 2024-02-09 DIAGNOSIS — Z789 Other specified health status: Secondary | ICD-10-CM

## 2024-02-09 NOTE — Telephone Encounter (Signed)
 Source  Angela Daniels (Patient)   Subject  Angela Daniels (Patient)   Topic  Clinical - Request for Lab/Test Order     Communication  Reason for CRM: Patient called in and is requesting orders for blood work for measles, patient would like to be tested to know if she has had measles in the past/had a vaccine. Patient states she had a possible exposure and that is why she is requesting this testing, after speaking with the health department she was told that this is something that her primary care doctor would handle not them. Patient was born outside of the [place]  and doesn't have a complete record of her immunizations so she does not know if she has been vaccinated or not. Patient is also wanting to know if there is a more broad blood test that can be done showing what she is immune to vs not. Please advise / put in orders. # [phone]

## 2024-02-10 NOTE — Addendum Note (Signed)
 Addended by: Teshaun Olarte M on: 02/10/2024 11:19 AM   Modules accepted: Orders

## 2024-02-10 NOTE — Telephone Encounter (Signed)
 Spoke with the patient, informed her of the message below and scheduled a lab appt on 6/30.

## 2024-02-10 NOTE — Telephone Encounter (Signed)
 Order placed, she will need lab appt

## 2024-02-13 ENCOUNTER — Other Ambulatory Visit

## 2024-02-13 DIAGNOSIS — Z789 Other specified health status: Secondary | ICD-10-CM

## 2024-02-15 LAB — MEASLES/MUMPS/RUBELLA IMMUNITY
Mumps IgG: 115 [AU]/ml
Rubella: 3.93 {index}
Rubeola IgG: >300 [AU]/ml

## 2024-02-21 ENCOUNTER — Ambulatory Visit: Payer: Self-pay

## 2024-02-21 ENCOUNTER — Ambulatory Visit: Payer: Self-pay | Admitting: Family Medicine

## 2024-02-21 NOTE — Telephone Encounter (Signed)
 Appt scheduled with Dr Mercer on 7/9.

## 2024-02-21 NOTE — Telephone Encounter (Signed)
 FYI Only or Action Required?: FYI only for provider.  Patient was last seen in primary care on 09/09/2023 by Ozell Heron HERO, MD.  Called Nurse Triage reporting No chief complaint on file..  Symptoms began a week ago.  Interventions attempted: Nothing.  Symptoms are: stable.  Triage Disposition: See Physician Within 24 Hours  Patient/caregiver understands and will follow disposition?: Yes  Copied from CRM [id number]. Topic: Clinical - Red Word Triage >> Feb 21, 2024 11:40 AM Suzen RAMAN wrote: Red Word that prompted transfer to Nurse Triage: dizziness and almost passed out. Reason for Disposition  [1] MODERATE dizziness (e.g., interferes with normal activities) AND [2] has NOT been evaluated by doctor (or NP/PA) for this  (Exception: Dizziness caused by heat exposure, sudden standing, or poor fluid intake.)  Taking a medicine that could cause dizziness (e.g., blood pressure medications, diuretics)  Answer Assessment - Initial Assessment Questions 1. DESCRIPTION: Describe your dizziness.     Weakness, Lightheadedness  2. LIGHTHEADED: Do you feel lightheaded? (e.g., somewhat faint, woozy, weak upon standing)     Woozy, Faint   3. VERTIGO: Do you feel like either you or the room is spinning or tilting? (i.e. vertigo)     No  4. SEVERITY: How bad is it?  Do you feel like you are going to faint? Can you stand and walk?   - MILD: Feels slightly dizzy, but walking normally.   - MODERATE: Feels unsteady when walking, but not falling; interferes with normal activities (e.g., school, work).   - SEVERE: Unable to walk without falling, or requires assistance to walk without falling; feels like passing out now.      Mild to Moderate  5. ONSET:  When did the dizziness begin?     One Week ago  6. AGGRAVATING FACTORS: Does anything make it worse? (e.g., standing, change in head position)     No  7. HEART RATE: Can you tell me your heart rate? How many beats in 15  seconds?  (Note: not all patients can do this)       No  8. CAUSE: What do you think is causing the dizziness?     Unsure  9. RECURRENT SYMPTOM: Have you had dizziness before? If Yes, ask: When was the last time? What happened that time?     No  10. OTHER SYMPTOMS: Do you have any other symptoms? (e.g., fever, chest pain, vomiting, diarrhea, bleeding)       No  11. PREGNANCY: Is there any chance you are pregnant? When was your last menstrual period?       No  Protocols used: Dizziness - Lightheadedness-A-AH

## 2024-02-22 ENCOUNTER — Emergency Department (HOSPITAL_BASED_OUTPATIENT_CLINIC_OR_DEPARTMENT_OTHER)
Admission: EM | Admit: 2024-02-22 | Discharge: 2024-02-22 | Disposition: A | Discharge status: Home / Self Care | Source: Ambulatory Visit | Attending: Emergency Medicine | Admitting: Emergency Medicine

## 2024-02-22 ENCOUNTER — Telehealth: Payer: Self-pay | Admitting: Student

## 2024-02-22 ENCOUNTER — Other Ambulatory Visit: Payer: Self-pay

## 2024-02-22 ENCOUNTER — Encounter: Payer: Self-pay | Admitting: Family Medicine

## 2024-02-22 ENCOUNTER — Ambulatory Visit: Admitting: Family Medicine

## 2024-02-22 ENCOUNTER — Ambulatory Visit (INDEPENDENT_AMBULATORY_CARE_PROVIDER_SITE_OTHER): Admitting: Family Medicine

## 2024-02-22 ENCOUNTER — Encounter (HOSPITAL_BASED_OUTPATIENT_CLINIC_OR_DEPARTMENT_OTHER): Payer: Self-pay | Admitting: Emergency Medicine

## 2024-02-22 ENCOUNTER — Telehealth: Payer: Self-pay

## 2024-02-22 ENCOUNTER — Emergency Department (HOSPITAL_BASED_OUTPATIENT_CLINIC_OR_DEPARTMENT_OTHER)

## 2024-02-22 ENCOUNTER — Emergency Department (HOSPITAL_BASED_OUTPATIENT_CLINIC_OR_DEPARTMENT_OTHER): Admitting: Radiology

## 2024-02-22 VITALS — BP 156/84 | HR 95 | Temp 98.2°F | Wt 173.6 lb

## 2024-02-22 DIAGNOSIS — K5900 Constipation, unspecified: Secondary | ICD-10-CM | POA: Insufficient documentation

## 2024-02-22 DIAGNOSIS — I3139 Other pericardial effusion (noninflammatory): Secondary | ICD-10-CM | POA: Insufficient documentation

## 2024-02-22 DIAGNOSIS — R55 Syncope and collapse: Secondary | ICD-10-CM | POA: Diagnosis not present

## 2024-02-22 DIAGNOSIS — K5790 Diverticulosis of intestine, part unspecified, without perforation or abscess without bleeding: Secondary | ICD-10-CM | POA: Insufficient documentation

## 2024-02-22 DIAGNOSIS — E782 Mixed hyperlipidemia: Secondary | ICD-10-CM | POA: Diagnosis not present

## 2024-02-22 DIAGNOSIS — I7121 Aneurysm of the ascending aorta, without rupture: Secondary | ICD-10-CM

## 2024-02-22 DIAGNOSIS — R42 Dizziness and giddiness: Secondary | ICD-10-CM

## 2024-02-22 DIAGNOSIS — Z79899 Other long term (current) drug therapy: Secondary | ICD-10-CM | POA: Insufficient documentation

## 2024-02-22 DIAGNOSIS — I471 Supraventricular tachycardia, unspecified: Secondary | ICD-10-CM | POA: Diagnosis not present

## 2024-02-22 DIAGNOSIS — I2510 Atherosclerotic heart disease of native coronary artery without angina pectoris: Secondary | ICD-10-CM | POA: Insufficient documentation

## 2024-02-22 DIAGNOSIS — Z91040 Latex allergy status: Secondary | ICD-10-CM | POA: Diagnosis not present

## 2024-02-22 DIAGNOSIS — I700 Atherosclerosis of aorta: Secondary | ICD-10-CM | POA: Insufficient documentation

## 2024-02-22 DIAGNOSIS — I10 Essential (primary) hypertension: Secondary | ICD-10-CM | POA: Diagnosis not present

## 2024-02-22 DIAGNOSIS — K449 Diaphragmatic hernia without obstruction or gangrene: Secondary | ICD-10-CM | POA: Insufficient documentation

## 2024-02-22 LAB — HEPATIC FUNCTION PANEL
ALT: 20 U/L (ref 0–44)
AST: 34 U/L (ref 15–41)
Albumin: 4.9 g/dL (ref 3.5–5.0)
Alkaline Phosphatase: 64 U/L (ref 38–126)
Bilirubin, Direct: <0.1 mg/dL (ref 0.0–0.2)
Total Bilirubin: 0.5 mg/dL (ref 0.0–1.2)
Total Protein: 8.7 g/dL — ABNORMAL HIGH (ref 6.5–8.1)

## 2024-02-22 LAB — BASIC METABOLIC PANEL WITH GFR
Anion gap: 11 (ref 5–15)
BUN: 12 mg/dL (ref 6–23)
BUN: 9 mg/dL (ref 8–23)
CO2: 32 meq/L (ref 19–32)
CO2: 26 mmol/L (ref 22–32)
Calcium: 9.5 mg/dL (ref 8.4–10.5)
Calcium: 9.2 mg/dL (ref 8.9–10.3)
Chloride: 98 meq/L (ref 96–112)
Chloride: 99 mmol/L (ref 98–111)
Creatinine, Ser: 0.81 mg/dL (ref 0.40–1.20)
Creatinine, Ser: 0.69 mg/dL (ref 0.44–1.00)
GFR, Estimated: >60 mL/min (ref >60)
GFR: 70.81 mL/min (ref >60.00)
Glucose, Bld: 102 mg/dL — ABNORMAL HIGH (ref 70–99)
Glucose, Bld: 88 mg/dL (ref 70–99)
Potassium: 4.7 meq/L (ref 3.5–5.1)
Potassium: 4 mmol/L (ref 3.5–5.1)
Sodium: 136 meq/L (ref 135–145)
Sodium: 136 mmol/L (ref 135–145)

## 2024-02-22 LAB — CBC WITH DIFFERENTIAL/PLATELET
Basophils Absolute: 0 K/uL (ref 0.0–0.1)
Basophils Relative: 0.6 % (ref 0.0–3.0)
Eosinophils Absolute: 0 K/uL (ref 0.0–0.7)
Eosinophils Relative: 0.2 % (ref 0.0–5.0)
HCT: 43.2 % (ref 36.0–46.0)
Hemoglobin: 14.7 g/dL (ref 12.0–15.0)
Lymphocytes Relative: 26.5 % (ref 12.0–46.0)
Lymphs Abs: 1.7 K/uL (ref 0.7–4.0)
MCHC: 34 g/dL (ref 30.0–36.0)
MCV: 89.3 fl (ref 78.0–100.0)
Monocytes Absolute: 0.4 K/uL (ref 0.1–1.0)
Monocytes Relative: 6.8 % (ref 3.0–12.0)
Neutro Abs: 4.3 K/uL (ref 1.4–7.7)
Neutrophils Relative %: 65.9 % (ref 43.0–77.0)
Platelets: 238 K/uL (ref 150.0–400.0)
RBC: 4.83 Mil/uL (ref 3.87–5.11)
RDW: 12.6 % (ref 11.5–15.5)
WBC: 6.5 K/uL (ref 4.0–10.5)

## 2024-02-22 LAB — LIPASE, BLOOD: Lipase: 29 U/L (ref 11–51)

## 2024-02-22 LAB — CBC
HCT: 40.8 % (ref 36.0–46.0)
Hemoglobin: 14 g/dL (ref 12.0–15.0)
MCH: 30.9 pg (ref 26.0–34.0)
MCHC: 34.3 g/dL (ref 30.0–36.0)
MCV: 90.1 fL (ref 80.0–100.0)
Platelets: 207 K/uL (ref 150–400)
RBC: 4.53 MIL/uL (ref 3.87–5.11)
RDW: 11.9 % (ref 11.5–15.5)
WBC: 6.8 K/uL (ref 4.0–10.5)
nRBC: 0 % (ref 0.0–0.2)

## 2024-02-22 LAB — TSH: TSH: 1.83 u[IU]/mL (ref 0.35–5.50)

## 2024-02-22 LAB — TROPONIN T, HIGH SENSITIVITY
Troponin T High Sensitivity: <15 ng/L (ref <19)
Troponin T High Sensitivity: <15 ng/L (ref <19)

## 2024-02-22 LAB — MAGNESIUM: Magnesium: 2.1 mg/dL (ref 1.5–2.5)

## 2024-02-22 MED ORDER — IOHEXOL 350 MG/ML SOLN
100.0000 mL | Freq: Once | INTRAVENOUS | Status: AC | PRN
  Administered 2024-02-22: 100 mL via INTRAVENOUS

## 2024-02-22 MED ORDER — HYDRALAZINE HCL 10 MG PO TABS: 10.0000 mg | ORAL_TABLET | Freq: Three times a day (TID) | ORAL | 0 refills | Status: DC | PRN

## 2024-02-22 NOTE — Discharge Instructions (Addendum)
 Our cardiology team is going to call you in the next 48 hours to arrange for a second Zio patch and further discuss your symptoms with you.  If warranted they will try to move up your cardiology appointment.  Please rest, continue hydration, and return to emergency department if any episodes of syncope or passing out occur.  I sent in as needed blood pressure medication called hydralazine  to your pharmacy.  This is a 10 mg tablet that you can take every 8 hours as needed for lightheadedness and dizziness in the setting of elevated blood pressure readings.  Of note, our CT scan did find an incidental finding of a thickened uterus on your scans.  This can be related to fluctuation of hormones but in the setting of a 77 year old female we do recommend follow-up with the OB/GYN.

## 2024-02-22 NOTE — ED Notes (Signed)
 Lab results from earlier today in results

## 2024-02-22 NOTE — Patient Instructions (Signed)
 The repeat CT scan ordered by cardiology was to monitor your aneurysm.  Do not forget to call cardiology and schedule an appointment in the next few weeks.  If your symptoms becoming worse or start happening more frequently proceed to the nearest emergency department.

## 2024-02-22 NOTE — ED Triage Notes (Signed)
 Pt via pov from pcp after experiencing dizziness at her appointment. She has been feeling unwell following a near syncopal episode a week ago. Pt had some chest pressure and lightheadedness since then. Pt alert & oriented, nad noted.

## 2024-02-22 NOTE — Progress Notes (Signed)
 Established Patient Office Visit   Subjective  Patient ID: Angela Daniels, female    DOB: March 01, 1946  Age: 78 y.o. MRN: [id number]  Chief Complaint  Patient presents with   Medical Management of Chronic Issues    Patient came in today for Dizziness, patient flet like she was going to pass out a week ago, and states that she is just not feeling right Woozy faint, chest pressure is coming back from before, head pressure and light headed     78 year old female with pmh sig for HTN, ascending aortic aneurysm, SVT, CAD who is followed by Dr. Ozell and seen for acute on chronic concern.  Pt endorses presyncopal episode on 7/3.  Pt was able to sit down and notes improvement in symptoms after 10-15 minutes.  Had oatmeal and a cup of coffee that morning prior to symptoms starting around 11 AM.  Also notes a tightness in lower rib cage moving around to the back, head pressure.  Had similar episode earlier this year possibly related to stress.  Pt does note doing yard work a few days prior to the episode.  Denies changes in routine, foods, fluid intake, medications, palpitations, dizziness with change in position.  Has noticed BP and pulse higher than normal, now 130s-140s systolic and pulse around 75.  On diltiazem  120 mg daily.  Followed by cardiology.  Given Crestor  10 mg but has yet to do so due to reservations about the med.  A CT was ordered at last cardiology visit but has not been done yet.  Per patient he needed to wait 3 months after starting medication before having it done?.    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 06/03/2023   Asymptomatic microscopic hematuria 06/03/2023   Osteoporosis    Past Medical History:  Diagnosis Date   Allergy    Arthritis    Hypertension     07/20/23   Osteoporosis    Positive TB test    Thoracic aortic aneurysm [facility])    Past Surgical History:  Procedure Laterality Date   TONSILLECTOMY     1954   Social History   Tobacco Use   Smoking status: Never   Smokeless tobacco: Never  Vaping Use   Vaping status: Never Used  Substance Use Topics   Alcohol use: Yes    Comment: occasionally   Drug use: Never   Family History  Problem Relation Age of Onset   Hyperlipidemia Mother    Hypertension Mother    Hearing loss Mother    Kidney disease Father    Cancer Father        Bladder cancer   Hypertension Sister    Hypertension Brother    Cancer Brother    Allergies  Allergen Reactions   Shellfish Allergy Shortness Of Breath   Iodine     Severe burning sensation   Latex     Cannot tolerate for long periods of time.   Sulfa Antibiotics     ROS Negative unless stated above    Objective:     BP (!) 156/84 (Patient Position: Sitting, Cuff Size: Normal)   Pulse 95   Temp 98.2 F (36.8 C)   Wt 173 lb 9.6 oz (78.7 kg)   SpO2 98%   BMI 24.91 kg/m  BP Readings from Last  3 Encounters:  02/22/24 (!) 156/84  12/13/23 126/70  11/09/23 127/68   Wt Readings from Last 3 Encounters:  12/13/23 175 lb (79.4 kg)  10/17/23 174 lb 6.4 oz (79.1 kg)  10/13/23 170 lb (77.1 kg)      Physical Exam Constitutional:      General: She is not in acute distress.    Appearance: Normal appearance.  HENT:     Head: Normocephalic and atraumatic.     Right Ear: There is impacted cerumen.     Left Ear: There is impacted cerumen.     Nose: Nose normal.     Right Turbinates: Not enlarged or swollen.     Left Turbinates: Not enlarged or swollen.     Mouth/Throat:     Mouth: Mucous membranes are moist.  Eyes:     Extraocular Movements:     Right eye: Normal extraocular motion and no nystagmus.     Left eye: Normal extraocular motion and no nystagmus.  Cardiovascular:     Rate and Rhythm: Normal rate and regular rhythm. No  extrasystoles are present.    Heart sounds: Normal heart sounds. No murmur heard.    No gallop.  Pulmonary:     Effort: Pulmonary effort is normal. No respiratory distress.     Breath sounds: Normal breath sounds. No wheezing, rhonchi or rales.  Skin:    General: Skin is warm and dry.  Neurological:     Mental Status: She is alert and oriented to person, place, and time.        09/09/2023   10:05 AM 06/23/2023   10:21 AM  Depression screen PHQ 2/9  Decreased Interest 0 0  Down, Depressed, Hopeless 0 0  PHQ - 2 Score 0 0    No results found for any visits on 02/22/24.    Assessment & Plan:   Dizziness -     CBC with Differential/Platelet; Future -     Basic metabolic panel with GFR; Future -     TSH; Future -     D-dimer, quantitative; Future  Primary hypertension -     Basic metabolic panel with GFR; Future  Mixed hyperlipidemia  SVT (supraventricular tachycardia) (HCC) -     CBC with Differential/Platelet; Future -     Basic metabolic panel with GFR; Future -     TSH; Future -     Magnesium; Future  Aneurysm of ascending aorta without rupture (HCC)   Dizziness and BP elevation.  Discussed including dehydration, arrhythmia, worsening aneurysm, PE, increased anxiety, seasonal allergies, electrolyte deficiency, thyroid  dysfunction.  As no changes in diet noted discussed obtaining labs.  Pt advised to schedule follow-up appointment with cardiology regarding symptoms.  EKG not performed due to normal rhythm on exam.  LDL 123 on 11/24.  Patient advised to consider starting statin.  Discussed further in cardiology.  Coronary CTA 10/2023 with mildly dilated aortic root measuring 3.8 cm and a 4.1 cm ascending thoracic aortic aneurysm.  Repeat due 10/2024.  Consider switching Cardizem  to beta-blocker given history of aneurysm.  Echo 11/10/2023 with EF 60-65% LA, RA mildly dilated.  Small pericardial effusion present without cardiac tamponade.  Mild MV regurg.  Moderate TV regurg.   Patient given strict precautions for worsening symptoms.   Return if symptoms worsen or fail to improve.  Encouraged to keep follow-up appointment with pcp in the next few weeks.  Angela JONELLE Single, MD

## 2024-02-22 NOTE — ED Provider Notes (Signed)
 Pt is a 78 yo female presenting for syncope that occurred today in pcp office. She was presenting to pcp for syncope and dizziness for weeks. Some epigastric pain radiating to the back.  -Ecg and trops are stable. Stable hemoglobin. Stable electrolytes. No stroke like symptoms. -CTH, chest, abd/pelvis pending. -Some HTN 168/84  Plan for dc home if scans are stable.    Physical Exam  BP (!) 159/85   Pulse (!) 59   Temp 98 F (36.7 C)   Resp 10   Ht 5' 10 (1.778 m)   Wt 78.7 kg   SpO2 97%   BMI 24.89 kg/m   Physical Exam  Procedures  Procedures  ED Course / MDM    Medical Decision Making Amount and/or Complexity of Data Reviewed Labs: ordered. Radiology: ordered.  Risk Prescription drug management.   CT head demonstrated no acute process.  CT chest abdomen pelvis demonstrated no aortic dissections.  Pericardial effusion was present that was seen on most recent echocardiogram in March of this year.  I spoke with our on-call cardiology team who is reviewed the CT images as well as the patient's history and presenting symptoms for today.  They think it is unlikely that the pericardial effusion is resulting in their symptoms.  There is been no cardiac arrhythmias throughout patient's visit over the past 5 to 6 hours today.  Patient's troponins and electrolytes are stable.  There is been no hypoxia or abnormal vitals outside of hypertension.  Patient is already taking Cardizem  and has a follow-up appointment at the end of the month with cardiology.  Our cardiology team is going to speak with the patient and see whether or not a Zio patch is warranted at this time.  I have ordered a as needed hydralazine  medication for the patient in the setting of lightheadedness, dizziness, and blood pressure readings above 160/at home to see if this improves her symptoms.  Incidental finding of thickened endometrial stripe on CT scan.  Patient given recommendations to follow-up with OB/GYN for  further imaging if warranted.  Patient in no distress and overall condition improved here in the ED. Detailed discussions were had with the patient regarding current findings, and need for close f/u with PCP or on call doctor. The patient has been instructed to return immediately if the symptoms worsen in any way for re-evaluation. Patient verbalized understanding and is in agreement with current care plan. All questions answered prior to discharge.        Elnor Hila P, DO 02/22/24 [id number]

## 2024-02-22 NOTE — Telephone Encounter (Signed)
 Call to patient to discuss concerns. Patient verifies she wanted to discuss outcome of her PCP visit today and advise to f/u with cardiology- she has f/u appt scheduled for 03/12/24. She states she is currently at another doctor's appointment at this time with c/o lightheadedness. Advised patient to seek treatment for her symptoms with MD she is currently seeing and then call us  when she is feeling better and able to discuss her questions. Patient verbalizes understanding.

## 2024-02-22 NOTE — Telephone Encounter (Signed)
 Patient was called to come back in for additional labs, after the lab was drawn patient felt dizzy and light headed, patient was given apple juice by lab tech but still felt light headed, I talked to the patient and took BP 156/84 at 330 pm O2-98 P-74, I gave the patient some water and crackers, Dr. Mercer Advised that the patient go to the ED. Patient called and had her son pick her up, and is headed to the ED.

## 2024-02-22 NOTE — Addendum Note (Signed)
 Addended by: MERCER KIRSCH R on: 02/22/2024 03:19 PM   Modules accepted: Orders

## 2024-02-22 NOTE — Telephone Encounter (Signed)
 Noted- ok to close.

## 2024-02-22 NOTE — ED Notes (Signed)
 Urine sample sent to lab

## 2024-02-22 NOTE — ED Notes (Signed)
 Pt d/c instructions, medications, and follow-up care reviewed with pt and family. Pt and family verbalized understanding and had no further questions at time of d/c. Pt CA&Ox4, ambulatory, and in NAD at time of d/c

## 2024-02-22 NOTE — ED Provider Notes (Addendum)
 [place] EMERGENCY DEPARTMENT AT [facility] Provider Note   CSN: [id number] Arrival date & time: 02/22/24  1627     Patient presents with: Dizziness   Angela Daniels is a 78 y.o. female.   Patient with several complaints over the past week or so.  Patient seen by primary care doctor earlier today they called her back for additional blood work and on the second blood drawl she had a near syncopal episode.  Had something similar happen about a week ago as well.  There is been some bilateral lower chest kind of wrapping around to the back area intermittent pain at times.  And lightheadedness.  A little bit of dizziness at times.  No nausea no vomiting or diarrhea.  Medical history sniffer hypertension.  Patient has noted that her blood pressures are higher than usual.  Patient also has a thoracic aortic aneurysm by history.  Patient blood pressures also been higher this week than usual.  Here is 166/104.       Prior to Admission medications   Medication Sig Start Date End Date Taking? Authorizing Provider  cholecalciferol (VITAMIN D3) 25 MCG (1000 UNIT) tablet Take by mouth daily.    [provider]  denosumab  (PROLIA ) 60 MG/ML SOSY injection Inject 60 mg into the skin every 6 (six) months.    [provider]  diltiazem  (CARDIZEM  CD) 120 MG 24 hr capsule Take 1 capsule (120 mg total) by mouth daily. 10/17/23   Verlin Lonni BIRCH, MD  diphenhydrAMINE  (BENADRYL ) 50 MG capsule Take one capsule 1 hour prior to scan. 10/17/23   Verlin Lonni BIRCH, MD  loratadine (CLARITIN) 10 MG tablet Take 10 mg by mouth daily.    [provider]  rosuvastatin  (CRESTOR ) 10 MG tablet Take 1 tablet (10 mg total) by mouth daily. 12/13/23 03/12/24  Goodrich, Callie E, PA-C    Allergies: Shellfish allergy, Iodine, Latex, and Sulfa antibiotics    Review of Systems  Constitutional:  Negative for chills and fever.  HENT:  Negative for ear pain and sore throat.   Eyes:   Negative for pain and visual disturbance.  Respiratory:  Negative for cough and shortness of breath.   Cardiovascular:  Positive for chest pain. Negative for palpitations.  Gastrointestinal:  Positive for abdominal pain. Negative for diarrhea, nausea and vomiting.  Genitourinary:  Negative for dysuria and hematuria.  Musculoskeletal:  Positive for back pain. Negative for arthralgias.  Skin:  Negative for color change and rash.  Neurological:  Positive for dizziness, light-headedness and headaches. Negative for seizures and syncope.  All other systems reviewed and are negative.   Updated Vital Signs BP (!) 169/87   Pulse 66   Temp 98 F (36.7 C)   Resp 14   Ht 1.778 m (5' 10)   Wt 78.7 kg   SpO2 100%   BMI 24.89 kg/m   Physical Exam Vitals and nursing note reviewed.  Constitutional:      General: She is not in acute distress.    Appearance: Normal appearance. She is well-developed.  HENT:     Head: Normocephalic and atraumatic.     Mouth/Throat:     Mouth: Mucous membranes are moist.  Eyes:     Extraocular Movements: Extraocular movements intact.     Conjunctiva/sclera: Conjunctivae normal.     Pupils: Pupils are equal, round, and reactive to light.  Cardiovascular:     Rate and Rhythm: Normal rate and regular rhythm.     Heart sounds: No murmur  heard. Pulmonary:     Effort: Pulmonary effort is normal. No respiratory distress.     Breath sounds: Normal breath sounds.  Abdominal:     Palpations: Abdomen is soft.     Tenderness: There is no abdominal tenderness.  Musculoskeletal:        General: No swelling.     Cervical back: Normal range of motion and neck supple.     Right lower leg: No edema.     Left lower leg: No edema.  Skin:    General: Skin is warm and dry.     Capillary Refill: Capillary refill takes less than 2 seconds.  Neurological:     General: No focal deficit present.     Mental Status: She is alert and oriented to person, place, and time.   Psychiatric:        Mood and Affect: Mood normal.     (all labs ordered are listed, but only abnormal results are displayed) Labs Reviewed  BASIC METABOLIC PANEL WITH GFR  CBC  LIPASE, BLOOD  HEPATIC FUNCTION PANEL  TROPONIN T, HIGH SENSITIVITY  TROPONIN T, HIGH SENSITIVITY    EKG: EKG Interpretation Date/Time:  Wednesday February 22 2024 16:38:11 EDT Ventricular Rate:  84 PR Interval:  164 QRS Duration:  97 QT Interval:  369 QTC Calculation: 437 R Axis:   131  Text Interpretation: No significant change since last tracing Sinus rhythm Probable right ventricular hypertrophy Borderline T abnormalities, anterior leads Confirmed by Geraldene Hamilton [id number]) on 02/22/2024 4:40:44 PM  Radiology: [facility] Chest 2 View Result Date: 02/22/2024 CLINICAL DATA:  Dizziness. EXAM: CHEST - 2 VIEW COMPARISON:  October 13, 2023. FINDINGS: Stable cardiomediastinal silhouette. Both lungs are clear. The visualized skeletal structures are unremarkable. IMPRESSION: No active cardiopulmonary disease. Electronically Signed   By: Lynwood Landy Raddle M.D.   On: 02/22/2024 17:37     Procedures   Medications Ordered in the ED  iohexol  (OMNIPAQUE ) 350 MG/ML injection 100 mL (has no administration in time range)                                    Medical Decision Making Amount and/or Complexity of Data Reviewed Labs: ordered. Radiology: ordered.   Patient's initial troponin less than 15 which is reassuring for dissection.  CBC white count 6.8 hemoglobin 14 platelets 207 patient metabolic panel normal including renal function.  Added on lipase and hepatic which is pending because of the upper abdominal pain.  Patient will have delta troponin as well.  Patient is near syncope that occurred with the blood drawl probably vasovagal but did have a syncopal episode a week ago.  Patient has CT angio chest for thoracic dissection and CT abdomen and pelvis as well.  CT head.  Also will do cardiac monitoring.  EKG  here without significant change compared to old.  Patient is delta troponin less than 15 so no concerns there.  Lipase is normal.  Hepatic function panel is normal.  CT head CT abdomen pelvis CT chest aorta with and without pending because of history of thoracic aneurysm.  Once again with the troponins being normal that is very reassuring.  2 view chest x-ray without any acute cardiopulmonary disease  Final diagnoses:  Near syncope    ED Discharge Orders     None          Emmersen Garraway, MD 02/22/24 1907    Loel Betancur,  MD 02/22/24 2029

## 2024-02-22 NOTE — Telephone Encounter (Signed)
 Pt called in asking to speak with someone about her visit with PCP today.

## 2024-02-23 ENCOUNTER — Telehealth: Payer: Self-pay | Admitting: Student in an Organized Health Care Education/Training Program

## 2024-02-23 LAB — D-DIMER, QUANTITATIVE: D-Dimer, Quant: 0.38 ug{FEU}/mL (ref <0.50)

## 2024-02-23 NOTE — Telephone Encounter (Signed)
 Spoke to Dr. Elnor about patient for syncopal evaluation. She was evaluated at [facility] ED for syncope after being referred by her PCP.  She had some lab work on 07/09 and needed additional f/u labs and later in the day had dizziness/lightheadedness during the second lab draw. ECG (02/22/24, 16:38:11) with NSR 84, PR 154, QRS 97, QT/c 369/437 and no ischemic changes. NSR in the 80s during ED observation. hsT r/o (<15 x2). Labs reviewed from earlier in the day and all WNLs. She apparently had a few weeks of intermittent presyncopal sx/lightheadedness.  Reviewed different options including overnight observation versus discharge home with outpatient follow-up.  She felt comfortable with discharge and f/u. Offered to coordinate Zio patch placement (14 days).   On later discussion with Dr. Elnor the patient reported the only other episode of syncope was lightheadedness after working outside during the heat wave a few weeks ago. No actual syncopal event. Reviewed monitor from 09/2023 and only had a few short runs of SVT, longest of which was 8 seconds. Will call this morning and can still offer Zio although with this recent monitor probably low yield with sx above.

## 2024-02-23 NOTE — Telephone Encounter (Signed)
 Spoke with Angela Daniels this morning. She reports that around 1 week ago she felt abrupt presyncope while she was at her house. That was 1.5 days after she played tennis and had a full day of yard work so she thought maybe it was related. That episode passed quickly. She then had dizziness/lightheadedness with some chest pressure throughout the week. Then had worsening HTN. She has had similar sx before that went away and she thought may have been related to life stressors with husband passing. She had not had the lightheadedness or dizziness before this week. Denies any associated palpitations. Prior Ziopatch didn't have any correlating arrhythmia to her sx which at that time was different from current. Spent over 30 minutes discussing multiple sx that have changed over time. Offered to order Zio but thought this would probably be low yield. She has f/u already scheduled. If she has more sx she can contact our office and we can order Zio before.   TTE Result date: 11/10/23  1. Left ventricular ejection fraction, by estimation, is 60 to 65%. The  left ventricle has normal function. The left ventricle has no regional  wall motion abnormalities. Left ventricular diastolic parameters were  normal.   2. Right ventricular systolic function is normal. The right ventricular  size is normal. There is normal pulmonary artery systolic pressure.   3. Left atrial size was mildly dilated.   4. Right atrial size was mildly dilated.   5. A small pericardial effusion is present. There is no evidence of  cardiac tamponade.   6. The mitral valve is normal in structure. Mild mitral valve  regurgitation. No evidence of mitral stenosis.   7. Tricuspid valve regurgitation is moderate.   8. The aortic valve is tricuspid. Aortic valve regurgitation is trivial.  No aortic stenosis is present.   9. The inferior vena cava is normal in size with greater than 50%  respiratory variability, suggesting right atrial pressure of 3  mmHg.   CTA Result date: 11/09/23 1. Coronary calcium  score of 12.4. This was 34 percentile for age-, sex, and race-matched controls. 2. Total plaque volume 80 mm3 which is 24 percentile for age- and sex-matched controls (calcified plaque 3 mm3; non-calcified plaque 77 mm3). TPV is (mild). 3. Normal coronary origin with right dominance. 4. Minimal plaque in the LAD and diagonal. 5. Mildly dilated aortic root (3.8 cm); mild aortic atherosclerosis. RECOMMENDATIONS: CAD-RADS 1: Minimal non-obstructive CAD (0-24%). Consider non-atherosclerotic causes of chest pain. Consider preventive therapy and risk factor modification.  Holter monitor Result date: 10/05/23 NSR with sinus brady (46/min) and sinus tachy (194/min), ave 71/min. One episodes of NSVT, 4 beats at 194/min. NS SVT, 6 beats at 190 and 13 beats at 100/min. Rare PVC's and PAC's (<1%) No atrial fib or sustained VT or SVT. Symptoms associated with NSR Patch Wear Time:  13 days and 17 hours (2025-01-28T16:46:08-498 to 2025-02-11T10:25:20-0500)  CT score Result date: 07/13/23 1. Patient's total coronary artery calcium  score is 8 which is 32nd percentile for patient's of matched age, gender and race/ethnicity. Please note that although the presence of coronary artery calcium  documents the presence of coronary artery disease, the severity of this disease and any potential stenosis cannot be assessed on this noncontrast CT examination. Assessment for potential risk factor modification, dietary therapy or pharmacologic therapy may be warranted, if clinically indicated. 2.  Aortic Atherosclerosis (ICD10-I70.0). 3. Ectasia of ascending thoracic aorta (4.3 cm in diameter). Recommend annual imaging followup by CTA or MRA. This recommendation follows  2010 ACCF/AHA/AATS/ACR/ASA/SCA/SCAI/SIR/STS/SVM Guidelines for the Diagnosis and Management of Patients with Thoracic Aortic Disease. Circulation. 2010; 121: [id number]. Aortic aneurysm  NOS (ICD10-I71.9).

## 2024-02-24 ENCOUNTER — Ambulatory Visit: Payer: Self-pay | Admitting: Family Medicine

## 2024-02-26 ENCOUNTER — Ambulatory Visit: Payer: Self-pay | Admitting: Student

## 2024-02-27 ENCOUNTER — Ambulatory Visit: Payer: Self-pay

## 2024-02-27 NOTE — Telephone Encounter (Signed)
 Appt scheduled with Dr Johnny on 7/15.

## 2024-02-27 NOTE — Telephone Encounter (Signed)
  FYI Only or Action Required?: FYI only for provider.  Patient was last seen in primary care on 02/22/2024 by Mercer Clotilda SAUNDERS, MD.  Called Nurse Triage reporting [place].  Symptoms began today.  Interventions attempted: Nothing.  Symptoms are: unchanged.  Triage Disposition: Home Care  Patient/caregiver understands and will follow disposition?: Yes            Copied from CRM [id number]. Topic: Clinical - Red Word Triage >> Feb 27, 2024  4:03 PM [name] wrote: Red Word that prompted transfer to Nurse Triage: Woke up this morning, soreness behind right, towards the inside the back of  knee- feels a bump buldge swelling where she feels the discomfort.   Last week went to the drs office and to the ED Reason for Disposition  [1] Small swelling or lump AND [2] unexplained AND [3] present < 1 week  Answer Assessment - Initial Assessment Questions 1. APPEARANCE of SWELLING: What does it look like?     Bump behind right knee 2. SIZE: How large is the swelling? (e.g., inches, cm; or compare to size of pinhead, tip of pen, eraser, coin, pea, grape, ping pong ball)       Golf ball size 3. LOCATION: Where is the swelling located?     Behind right knee 4. ONSET: When did the swelling start?     today 5. COLOR: What color is it? Is there more than one color?     Normal skin color 6. PAIN: Is there any pain? If Yes, ask: How bad is the pain? (Scale 1-10; or mild, moderate, severe)       Barely noticeable 7. ITCH: Does it itch? If Yes, ask: How bad is the itch?      no 8. CAUSE: What do you think caused the swelling?     unknown 9 OTHER SYMPTOMS: Do you have any other symptoms? (e.g., fever)     Other symptoms of tiredness and pressure under chest have not resolved.  Protocols used: Skin Lump or Localized Swelling-A-AH

## 2024-02-27 NOTE — Progress Notes (Signed)
 Cardiology Office Note:    Date:  03/12/2024   ID:  Angela Daniels, DOB [birth year], MRN [id number]  PCP:  Ozell Heron HERO, MD  Cardiologist:  Lonni Cash, MD     Referring MD: Ozell Heron HERO, MD   Chief Complaint: ED follow-up of dizziness  History of Present Illness:    Angela Daniels is a 78 y.o. 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 Cash and presents today for ED follow-up of dizziness.  Patient moved from [place]  to [facility] in 02/2023. She was recently referred to Dr. Cash in 10/2023 after a recent ED visit for chest pain. Work-up in the ED was unremarkable. Prior coronary calcium  score in 06/2023 was 8 (32nd percentile for age and sex). At initial visit, she reported chest pressure at rest and with exertion as well as palpitations and dyspnea. Recent cardiac monitor in 08/2023 showed normal sinus rhythm with one 4 beat run of NSVT, a couple of short runs of SVT (longest run 13 beats), and rare PACs/ PVCs but no significant arrhythmias. Amlodipine  was stopped and she was switched to Cardizem  CD instead. Coronary CTA and Echo were ordered for further evaluation. Coronary CTA showed a coronary calcium  score of 12.4 (34th percentile for age and sex), total plaque volume of 80 mm3 (24th percentile for age and sex), and only minimal plaque in the LAD and Diag but no obstructive CAD. 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 Echo showed LVEF of 60-65% with normal wall motion and diastolic parameters, normal RV function, mild biatrial enlargement, mild MR, moderate TR, and a small pericardial effusion.   She was  last seen by me in 11/2023 at which time she felt like her symptoms were abating and she had not had any chest pain/ pressure in a while. She did report some mild dyspnea on exertion which she attributed to deconditioning but stated her energy level was improving.  She was seen in the ED on 02/22/2024 for further evaluation of dizziness and near syncope as well as some bilateral lower upper abdominal/ lower chest pain that wrapped around to her back. EKG showed no acute ischemic changes. High-sensitivity troponin negative x2. Lipase and LFTs were normal. Head CT, chest CTA, and abdominal/ pelvic CT were ordered and showed no acute findings to explain symptoms. Of note, CTA showed no evidence of aortic aneurysm or dissection. Ascending aorta was borderline in size and prior aneurysmal measurements appear to have been from axial slices obtained at obliquity exaggerating the diameter.   Right lower extremity venous doppler on 02/28/2024 showed findings consistent with a chronic superficial vein thrombosis involving the right great saphenous vein and right superficial veins/ varicosities. She was started on Eliquis  by her PCP's office.  Patient presents today for follow-up. Here alone.  She has multiple concerns today but we discussed at great length.  She states that she just does not feel right and has not for about the past 3 weeks.  She describes feeling weak, shaky, and off balance.  She states she feels deconditioned like how you feel when you are recovering from the flu.  She states she she typically wakes up first thing in the morning and  feels fine but then developed the symptoms throughout the day.  She has had a couple episodes of dizziness and near syncope.  One episode occurred on 02/16/2024 when she was standing for a prolonged time and organizing things on her dresser. She had another episode on 02/22/2024 after going to her PCPs office and getting blood work drawn.  This is what led to the recent ED  visit where workup was unremarkable.  She has not had any significant dizziness or near syncope since then.  No overt syncope.  She states yesterday was a bad day she felt very poorly yesterday morning.  Shortly after sitting up, she developed a headache, face pressure, states her neck felt funny.  She also developed some mild chest pressure and described feeling shaky but no significant dizziness.  Symptoms gradually subsided.  She is very concerned about feeling this way.  We discussed that she has had a very reassuring cardiac workup over the last 6 months.  From a cardiac standpoint, it sounds like she is relatively stable.  She does continue to have some occasional episodes of chest pressure that occur only at rest.  She denies any chest pressure with exertion such as when she is playing tennis or gardening.  She states this is much better than what it was a few months ago when the coronary CTA was ordered.  She denies any significant shortness of breath but does state that she will occasionally notice that she is breathing harder.  No orthopnea, PND, or edema.  No significant palpitations.  No syncope.  EKGs/Labs/Other Studies Reviewed:    The following studies were reviewed:  Monitor 09/13/2023 to 09/27/2023: NSR with sinus brady (46/min) and sinus tachy (194/min), ave 71/min. One episodes of NSVT, 4 beats at 194/min. NS SVT, 6 beats at 190 and 13 beats at 100/min. Rare PVC's and PAC's (<1%) No atrial fib or sustained VT or SVT. Symptoms associated with NSR _______________   Coronary CTA 11/09/2023: Impressions: 1. Coronary calcium  score of 12.4. This was 34 percentile for age-, sex, and race-matched controls. 2. Total plaque volume 80 mm3 which is 24 percentile for age- and sex-matched controls (calcified plaque 3 mm3; non-calcified plaque 77 mm3). TPV is (mild). 3. Normal coronary origin with right dominance. 4. Minimal plaque in the LAD and diagonal. 5. Mildly dilated aortic root  (3.8 cm); mild aortic atherosclerosis. _______________   Echocardiogram 11/10/2023: Impressions:  1. Left ventricular ejection fraction, by estimation, is 60 to 65%. The  left ventricle has normal function. The left ventricle has no regional  wall motion abnormalities. Left ventricular diastolic parameters were  normal.   2. Right ventricular systolic function is normal. The right ventricular  size is normal. There is normal pulmonary artery systolic pressure.   3. Left atrial size was mildly dilated.   4. Right atrial size was mildly dilated.   5. A small pericardial effusion is present. There is no evidence of  cardiac tamponade.   6. The mitral valve is normal in structure. Mild mitral valve  regurgitation. No evidence of mitral stenosis.   7. Tricuspid valve regurgitation is moderate.   8. The aortic valve is tricuspid. Aortic valve regurgitation is trivial.  No aortic stenosis is present.   9. The inferior vena cava is normal in size with greater than 50%  respiratory variability, suggesting right atrial pressure of 3 mmHg.   Comparison(s): No prior Echocardiogram.   Conclusion(s)/Recommendation(s): Otherwise normal echocardiogram, with  minor abnormalities described in the report.  _______________  Chest CTA 02/22/2024: Summary: - No evidence of pulmonary emboli with borderline prominent pulmonary trunk. - Stable cardiomegaly with small pericardial effusion. - Aortic and coronary artery atherosclerosis. No aortic aneurysm or dissection. Borderline size ascending aorta 3.8 x 3.8 cm. Prior aneurysmal measurements appear to have been from axial slices obtained at obliquity exaggerating the diameter. - Chronic scarring, bronchiectasis and bronchial impactions in the anteromedial base of the lingula and right middle lobe consistent with a chronic inflammatory/infectious process such as MAC.  EKG:  EKG not ordered today.   Recent Labs: 02/22/2024: ALT 20; BUN 9; Creatinine,  Ser 0.69; Hemoglobin 14.0; Magnesium 2.1; Platelets 207; Potassium 4.0; Sodium 136; TSH 1.83  Recent Lipid Panel    Component Value Date/Time   CHOL 201 (H) 06/23/2023 1131   TRIG 73.0 06/23/2023 1131   HDL 63.20 06/23/2023 1131   CHOLHDL 3 06/23/2023 1131   VLDL 14.6 06/23/2023 1131   LDLCALC 123 (H) 06/23/2023 1131    Physical Exam:    Vital Signs: BP 122/81 (BP Location: Left Arm, Patient Position: Sitting, Cuff Size: Normal)   Pulse 95   Ht 5' 10 (1.778 m)   Wt 170 lb (77.1 kg)   SpO2 98%   BMI 24.39 kg/m     Wt Readings from Last 3 Encounters:  03/12/24 170 lb (77.1 kg)  03/02/24 172 lb 9.6 oz (78.3 kg)  02/28/24 172 lb 3.2 oz (78.1 kg)     General: 78 y.o. Caucasian female in no acute distress. HEENT: Normocephalic and atraumatic. Sclera clear.  Neck: Supple. No carotid bruits. No JVD. Heart: RRR. Distinct S1 and S2. No murmurs, gallops, or rubs.  Lungs: No increased work of breathing. Clear to ausculation bilaterally. No wheezes, rhonchi, or rales.   Extremities: No lower extremity edema.   Skin: Warm and dry. Neuro: No focal deficits. Psych: Normal affect. Responds appropriately.   Assessment:    1. Dizziness   2. Near syncope   3. Coronary artery disease involving native coronary artery of native heart without angina pectoris   4. Paroxysmal SVT (supraventricular tachycardia) (HCC)   5. Mild mitral regurgitation   6. Moderate tricuspid regurgitation   7. Primary hypertension   8. Hyperlipidemia, unspecified hyperlipidemia type     Plan:    Dizziness Near Syncope Patient was seen in the ED earlier this month for dizziness/ near syncope. Work-up was unremarkable. Recent monitor in 08/2023 showed no significant arrhythmias. Recent Echo in 10/2023 showed normal LV function and no significant valvular disesae.  - No significant dizziness or recurrent near syncope since ED visit. - Does not sound cardiac in nature.  Discussed common causes of near syncope  including vasovagal and orthostatic causes. Discussed importance of staying well hydrated.  - Recommended Kardia Mobile device.  Minimal Non-Obstructive CAD Coronary CTA in 3/2025showed a coronary calcium  score of 12.4 (34th percentile for age and sex), total plaque volume of 80 mm3 (24th percentile for age and sex), and only minimal plaque in the LAD and Diag but no obstructive CAD.  - No angina. - Can hold off on starting Aspirin given only minimal CAD. - Continue statin. - Continue to increase physical activity as tolerated.   Paroxysmal SVT Patient has a history of palpitations. Recent monitor in 08/2023 showed normal sinus rhythm with one 4 beat run of NSVT, a couple of short runs of SVT (longest run 13 beats), and rare PACs/ PVCs but no significant arrhythmias. - Stable. No significant palpitations. - Continue Cardizem  CD  120mg  daily.    Mild Mitral Regurgitation Moderate Tricuspid Regurgitation Noted on recent Echo in 10/2023.  - Can continue routine monitoring with serial Echos.   Chronic Superficial Venous Thrombosis Recent right lower extremity venous dopplers on 02/28/2024 showed findings consistent with chronic superficial vein thrombosis involving the right great saphenous vein and right superficial veins/ varicosities but no DVT. She was started on Eliquis  by PCP's office. - Continue Eliquis  per PCP.    Hypertension BP well controlled.  - Continue Cardizem  CD 120mg  daily.    Hyperlipidemia Lipid panel in 06/2023: Total Cholesterol 201, Triglycerides 73, HDL 63.2, LDL 123. LDL goal <70 given CAD.  - She was prescribed Crestor  10mg  daily during last visit in 11/2023. However, she never started this due to concerns of side effects. We discussed this and she is agreeable to starting 5mg  daily to see how she tolerates this. - Will repeat lipid panel and LFTs in 3 months.  Of note, patient had multiple complaints today but I do not think they are cardiac in nature. She has had a  thorough cardiac work-up over the last 6 months which have been reassuring. I encouraged her to continue to discuss her symptoms with PCP. She is scheduled to see her PCP later today. Anxiety/ stress may be playing a role and she states she is open to seeing a therapist.  Disposition: Follow up in 3 months.   Signed, Aline FORBES Door, PA-C  03/12/2024 10:17 AM    [facility]

## 2024-02-27 NOTE — Telephone Encounter (Signed)
 Noted- ok to close.

## 2024-02-28 ENCOUNTER — Ambulatory Visit: Payer: Self-pay | Admitting: Family Medicine

## 2024-02-28 ENCOUNTER — Encounter: Payer: Self-pay | Admitting: Family Medicine

## 2024-02-28 ENCOUNTER — Ambulatory Visit (INDEPENDENT_AMBULATORY_CARE_PROVIDER_SITE_OTHER): Admitting: Family Medicine

## 2024-02-28 ENCOUNTER — Ambulatory Visit: Payer: Self-pay

## 2024-02-28 ENCOUNTER — Ambulatory Visit (HOSPITAL_COMMUNITY)
Admission: RE | Admit: 2024-02-28 | Discharge: 2024-02-28 | Disposition: A | Discharge status: Home / Self Care | Source: Ambulatory Visit | Attending: Family Medicine | Admitting: Family Medicine

## 2024-02-28 VITALS — BP 118/76 | HR 80 | Temp 97.8°F | Wt 172.2 lb

## 2024-02-28 DIAGNOSIS — M79604 Pain in right leg: Secondary | ICD-10-CM

## 2024-02-28 MED ORDER — APIXABAN 5 MG PO TABS
ORAL_TABLET | ORAL | 0 refills | Status: DC
Start: 2024-02-28 — End: 2024-03-12

## 2024-02-28 NOTE — Telephone Encounter (Signed)
 Noted

## 2024-02-28 NOTE — Telephone Encounter (Signed)
 This was taken care of

## 2024-02-28 NOTE — Addendum Note (Signed)
 Addended by: JOHNNY SENIOR A on: 02/28/2024 02:49 PM   Modules accepted: Orders

## 2024-02-28 NOTE — Progress Notes (Signed)
   Subjective:    Patient ID: Angela Daniels, female    DOB: August 24, 1945, 78 y.o.   MRN: [id number]  HPI Here for 2 days of a tender lump behind the left knee. No recent trauma. No swelling in the leg. Of note, she was in the ED on 02-22-24 for syncope, and she had nmerous tests performed. These included a WBC of 6.5 and a d dimer of 0.38.    Review of Systems  Constitutional: Negative.   Respiratory: Negative.    Cardiovascular: Negative.   Musculoskeletal:  Positive for myalgias.       Objective:   Physical Exam Constitutional:      Appearance: Normal appearance. She is not ill-appearing.  Cardiovascular:     Rate and Rhythm: Normal rate and regular rhythm.     Pulses: Normal pulses.     Heart sounds: Normal heart sounds.  Pulmonary:     Effort: Pulmonary effort is normal.     Breath sounds: Normal breath sounds.  Musculoskeletal:     Comments: There is a 4 cm firm mobile tender mass on the medial side of the posterior right knee. The area is red and warm   Neurological:     Mental Status: She is alert.           Assessment & Plan:  Inflamed lipoma. She will stay off her feet and apply ice packs. She can take Ibuprofen as needed. Set up a venous doppler to rule out any thrombi.  Garnette Olmsted, MD

## 2024-02-28 NOTE — Telephone Encounter (Signed)
                FYI Only or Action Required?:  Summary: Eloquis   Patient was prescribed medication today for svt. Patient went to the pharmacy and prescription for eloquis is going to cost $485 for a 16 day supply. Patient would like to know what she could do and is there another medication that could work well.  [facility] stated she would basically have to pay out of pocket for the prescription and patient is confused given her insurance.  apixaban  (ELIQUIS ) 5 MG TABS tablet          Patient was last seen in primary care on 02/28/2024 by Johnny Garnette LABOR, MD.  Called Nurse Triage reporting Medication Problem.  Symptoms began today.  Interventions attempted: Nothing.  Symptoms are: stable.  Triage Disposition: Call PCP Now  Patient/caregiver understands and will follow disposition?:                    Summary: Eloquis   Patient was prescribed medication today for svt. Patient went to the pharmacy and prescription for eloquis is going to cost $485 for a 16 day supply. Patient would like to know what she could do and is there another medication that could work well.  [facility] stated she would basically have to pay out of pocket for the prescription and patient is confused given her insurance.  apixaban  (ELIQUIS ) 5 MG TABS tablet         Reason for Disposition  [1] Pharmacy calling with prescription questions AND [2] triager unable to answer question  Answer Assessment - Initial Assessment Questions 1. DRUG NAME: What medicine do you need to have refilled?     Eloquis  Summary: Eloquis  Patient was prescribed medication today for svt. Patient went to the pharmacy and prescription for eloquis is going to cost $485 for a 16 day supply. Patient would like to know what she could do and is there another medication that could work well.  [facility] stated she would basically have to pay out of pocket for the prescription and  patient is confused given her insurance.  apixaban  (ELIQUIS ) 5 MG TABS tablet  Protocols used: Medication Refill and Renewal Call-A-AH

## 2024-02-28 NOTE — Telephone Encounter (Signed)
 FYI Only or Action Required?: Action required by provider: lab or test result follow-up needed.  Patient was last seen in primary care on 02/28/2024 by Johnny Garnette LABOR, MD.  Called Nurse Triage reporting lab results.  Triage Disposition: Callback by PCP Today (overriding Call PCP Now)       Copied from CRM [id number]. Topic: Clinical - Lab/Test Results >> Feb 28, 2024  2:16 PM [name] wrote: Reason for CRM: Nanetta from [facility] called in and stated that patient is positive for superficial vein thrombosis Reason for Disposition  Lab or radiology calling with CRITICAL test results  Answer Assessment - Initial Assessment Questions 1. REASON FOR CALL or QUESTION: What is your reason for calling today? or How can I best     Critical results for pt  2. CALLER: Document the source of call. (e.g., laboratory staff, caregiver or patient).     Nanetta from [facility] Giving preliminary report for Dr. Johnny  Positive for chronic superficial thrombosis so clot in great saphenous vein and chronic clot in varicosity or varicose vein  Patient is still here, not eligible for DVT clinic, they will not prescribe. Myles Nanetta that this nurse will relay results to office for pt to receive call back with next steps.  Called [facility] and relayed results to triage nurse in office. Confirmed office will give pt call back with next steps.  Protocols used: PCP Call - No Triage-A-AH

## 2024-02-28 NOTE — Telephone Encounter (Signed)
 We spoke to the patient and started her on Eliquis  this morning

## 2024-02-28 NOTE — Telephone Encounter (Signed)
 Patient was previously seen by Dr Johnny

## 2024-02-29 ENCOUNTER — Telehealth: Payer: Self-pay | Admitting: Cardiovascular Disease

## 2024-02-29 NOTE — Telephone Encounter (Signed)
 Patient wants a call back directly from PA [facility] regarding test results and upcoming visit.

## 2024-02-29 NOTE — Telephone Encounter (Signed)
 Spoke with pt regarding her symptoms. Pt stated she was doing housework when she had an episode on 7/3 where she became very weak and almost collapsed. She was able to sit down quickly and never lost consciousness but did not feel well for about 10 minutes. Pt stated she took her blood pressure afterward and it was 130/72 and pulse was 60. Pt saw her PCP on 7/9 and they did a complete work up. Pt stated they recommended she go to the ED because she might be having a stroke. Pt stated all of the tests they did should be in the chart. Pt stated she has been starting to feel better but would like someone to look over all of her tests. She has an appointment with Aline Door, PA-C 7/28 and an appointment with her PCP the day after. Her blood pressures have been running 110-130/70-85. Pt has not had an episode since 7/3. Pt was told the information she provided would be sent to Callie for review. Pt verbalized understanding. All questions if any were answered.

## 2024-03-02 ENCOUNTER — Encounter: Payer: Self-pay | Admitting: Family Medicine

## 2024-03-02 ENCOUNTER — Ambulatory Visit: Admitting: Family Medicine

## 2024-03-02 VITALS — BP 150/92 | HR 87 | Temp 98.1°F | Ht 70.0 in | Wt 172.6 lb

## 2024-03-02 DIAGNOSIS — I82811 Embolism and thrombosis of superficial veins of right lower extremities: Secondary | ICD-10-CM

## 2024-03-02 DIAGNOSIS — J479 Bronchiectasis, uncomplicated: Secondary | ICD-10-CM | POA: Diagnosis not present

## 2024-03-02 DIAGNOSIS — R42 Dizziness and giddiness: Secondary | ICD-10-CM

## 2024-03-02 DIAGNOSIS — N281 Cyst of kidney, acquired: Secondary | ICD-10-CM | POA: Diagnosis not present

## 2024-03-02 DIAGNOSIS — R55 Syncope and collapse: Secondary | ICD-10-CM

## 2024-03-02 DIAGNOSIS — H6123 Impacted cerumen, bilateral: Secondary | ICD-10-CM | POA: Diagnosis not present

## 2024-03-02 NOTE — Telephone Encounter (Signed)
 Reviewed ED visit. Cardiac work-up in the ED was unremarkable - EKG and troponin negative. Her BP and HR on the day of this event were okay, and I would not suspect that this would have caused her symptoms. Recent monitor earlier this year was unremarkable.  No new cardiac recommendations at this time. We can discuss more at upcoming visit.   She had a lot of imaging done in the ED with some incidental non-cardiac findings such as chronic scarring, bronchiectasis in her lungs (consistent with a chronic inflammatory/ infectious process) and fluid in the uterine cavity with possible thickening of the endometrial stripe. However, I would recommend she follow-up with PCP for these things.   Thank you!

## 2024-03-02 NOTE — Telephone Encounter (Signed)
 Spoke to pt, relayed Angela Door, PA-C's feedback and recommendation to see PCP. We discussed in detail her ongoing concerns with the findings and her need for f/u. She will see OBGYN next Tuesday and is most concerned with her oxygen at 96-96 which is low for her. We discussed normal range for this, she will pass along her concerns to PCP today. Pt appreciates call.

## 2024-03-02 NOTE — Progress Notes (Signed)
 Established Patient Office Visit  Subjective   Patient ID: Angela Daniels, female    DOB: Oct 02, 1945  Age: 78 y.o. MRN: [id number]  Chief Complaint  Patient presents with   Hospitalization Follow-up    Patient presents for ER follow up from 7/9    Pt was seen in the ED on 7/9 due to presyncope symptoms. States that on 7/3 she almost fainted and she was just doing some tidying up her bureau, was standing at the time. It was sudden and it passed in about 10 minutes. Shewas seen by dr. Mercer on 7/9 because she just didn't feel right -- was light headed, dizzy and chest pressure, BP was elevated during that time. States that she had another episode of presyncope/lightheadedness and was sent to the ER for further work up.   Then she saw Dr. Johnny in 7/15 for a new symptoms of leg pain, US  was positive for  RIGHT:  - Findings consistent with chronic superficial vein thrombosis involving  the right great saphenous vein, and right superficial veins/varicosities.  - No cystic structure found in the popliteal fossa.  - All other veins visualized appear fully compressible and demonstrate  appropriate Doppler characteristics.     Pt states that she had an episode of her oxygen level dropping, states at the time she felt oncoming of her symptoms. States her oxygen dropped to 92%, CTA showed Lungs/Pleura: In the anteromedial base of the lingula and right middle lobe there is chronic scarring change, bronchiectasis and bronchial impactions consistent with a chronic inflammatory/infectious process such as MAC.  would like a referral to pulmonary.   Pt reports she was told she had cerumen impaction, was wondering if this was causing the dizziness. States that she does feel like her hearing is a little decreased but no vertigo or room spinning    Current Outpatient Medications  Medication Instructions   apixaban  (ELIQUIS ) 5 MG TABS tablet Take 2 pills (10 mg) twice daily for 7 days, then take one  pill twice daily   cholecalciferol (VITAMIN D3) 25 MCG (1000 UNIT) tablet Daily   denosumab  (PROLIA ) 60 mg, Every 6 months   diltiazem  (CARDIZEM  CD) 120 mg, Oral, Daily   diphenhydrAMINE  (BENADRYL ) 50 MG capsule Take one capsule 1 hour prior to scan.   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     Objective:     BP (!) 150/92   Pulse 87   Temp 98.1 F (36.7 C) (Oral)   Ht 5' 10 (1.778 m)   Wt 172 lb 9.6 oz (78.3 kg)   SpO2 98%   BMI 24.77 kg/m    Physical Exam Vitals reviewed.  Constitutional:      Appearance: Normal appearance. She is well-groomed and normal weight.  Cardiovascular:     Rate and Rhythm: Normal rate and regular rhythm.     Heart sounds: S1 normal and S2 normal.  Pulmonary:     Effort: Pulmonary effort is  normal.     Breath sounds: Normal breath sounds and air entry.  Neurological:     Mental Status: She is alert and oriented to person, place, and time. Mental status is at baseline.     Gait: Gait is intact.  Psychiatric:        Mood and Affect: Mood and affect normal.        Speech: Speech normal.        Behavior: Behavior normal.     IMPRESSION: 1. No acute intracranial CT findings. 2. Age related cerebral cortical volume loss and mild small-vessel disease. 3. No evidence of pulmonary emboli with borderline prominent pulmonary trunk. 4. Stable cardiomegaly with small pericardial effusion. 5. Aortic and coronary artery  atherosclerosis. No aortic aneurysm or dissection. Borderline size ascending aorta 3.8 x 3.8 cm. Prior aneurysmal measurements appear to have been from axial slices obtained at obliquity exaggerating the diameter. 6. Chronic scarring, bronchiectasis and bronchial impactions in the anteromedial base of the lingula and right middle lobe consistent with a chronic inflammatory/infectious process such as MAC. 7. Small hiatal hernia. 8. Constipation and diverticulosis. 9. Fluid in the uterine cavity, questionable thickening of the endometrial stripe more than typical in a 78 year old. Pelvic ultrasound is recommended. 10. Osteopenia and degenerative change.  No results found for any visits on 03/02/24.    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 I spent >30 minutes going over the results of the patient's CT scan. I offered recommendations to send to pulmonary for the lung findings and I counseled the patient on the Bosniak system. The dizziness could have been a vasovagal episode, she already has appt with cardiology on 7/28.   Return in about 4 months (around 07/03/2024), or follow up with me after you see the specialist, for will need to return for ear irrigation.    Heron CHRISTELLA Sharper, MD

## 2024-03-05 ENCOUNTER — Ambulatory Visit: Payer: Self-pay

## 2024-03-05 ENCOUNTER — Telehealth: Payer: Self-pay | Admitting: *Deleted

## 2024-03-05 NOTE — Telephone Encounter (Signed)
 FYI Only or Action Required?: FYI only for provider.  Patient was last seen in primary care on 03/02/2024 by Ozell Heron HERO, MD.  Called Nurse Triage reporting Referral.  Symptoms began several months ago.  Interventions attempted: Other: has been following with PCP/specialists without dx.  Symptoms are: unchanged.  Triage Disposition: Call Specialist Today, Call PCP Now  Patient/caregiver understands and will follow disposition?: Yes      Copied from CRM [id number]. Topic: Clinical - Red Word Triage >> Mar 05, 2024  8:29 AM Nathanel DEL wrote: Red Word that prompted transfer to Nurse Triage: pt instructed to go to ED last week.  She is still feeling dizzy, lightheaded.  Breathing more heavily  something is not right. Reason for Disposition  Oxygen level (e.g., pulse oximetry) 91 to 94%  Answer Assessment - Initial Assessment Questions 1. RESPIRATORY STATUS: Describe your breathing? (e.g., wheezing, shortness of breath, unable to speak, severe coughing)      SOB, chest pressure, dizziness, head pressure Endorses has been following sx with PCP - reports went to cardiology and dx testing Sx never resolved or officially diagnosed 2. ONSET: When did this breathing problem begin?      November  2 weeks ago with near syncope - went to PCP 7/9 and advised to [facility] ER - did imaging and was D/C home  Recent knee pain on 7/15 and had US  that found blood clot 3. PATTERN Does the difficult breathing come and go, or has it been constant since it started?      Dizziness/lightheadedness has been more constant Breathing symptoms with activity/exertion Head pressure-- attributed to allergies, but unknown cause Chest pressure- felt in November with improvement in [place] and now back again. Endorses intermittent pressure like wearing bra too tight 4. SEVERITY: How bad is your breathing? (e.g., mild, moderate, severe)      Mild with exertion Triager does not appreciate audible  SOB/wheezing during call. Pt is speaking in full sentences.  5. RECURRENT SYMPTOM: Have you had difficulty breathing before? If Yes, ask: When was the last time? and What happened that time?      First time since November 6. CARDIAC HISTORY: Do you have any history of heart disease? (e.g., heart attack, angina, bypass surgery, angioplasty)      denies 7. LUNG HISTORY: Do you have any history of lung disease?  (e.g., pulmonary embolus, asthma, emphysema)     denies 8. CAUSE: What do you think is causing the breathing problem?      unknown 9. OTHER SYMPTOMS: Do you have any other symptoms? (e.g., chest pain, cough, dizziness, fever, runny nose)     Dizziness, lightheadedness, chest pressure 10. O2 SATURATION MONITOR:  Do you use an oxygen saturation monitor (pulse oximeter) at home? If Yes, ask: What is your reading (oxygen level) today? What is your usual oxygen saturation reading? (e.g., 95%)       99 at baseline, more recently 92-95 11. PREGNANCY: Is there any chance you are pregnant? When was your last menstrual period?       N/a 12. TRAVEL: Have you traveled out of the country in the last month? (e.g., travel history, exposures)       N/a    Pt calling to schedule New Pt appt with [facility] and was transferred to NT d/t being symptomatic. Upon further investigation, pt has been following closely with PCP and cards specialist without any sx relief/diagnosis. Triager strongly advised to continue to F/U with PCP until pt is established  with [facility]. Scheduled patient on the next available New Pt appt on March 05, 2024 with Dr Geronimo.  Protocols used: Breathing Difficulty-A-AH

## 2024-03-05 NOTE — Telephone Encounter (Signed)
 I called the patient and advised her to seek treatment at the ER.  Patient states the symptoms come and go, has had these symptoms since last year in November and does notice symptoms with exertion.  States she received a call today from the pulmonary office,  has an appt scheduled in December and the call today was not to say she is in distress.  Message sent to Dr Micheal for assistance as PCP is out of the office.

## 2024-03-05 NOTE — Telephone Encounter (Signed)
 Left a detailed message at the patient's cell number stating due to the symptoms she is having, she should return to the ER and if needed I can call 911 for her if she is alone.  Will return care call later.

## 2024-03-05 NOTE — Telephone Encounter (Signed)
 See prior phone note-sent to Dr Micheal as PCP is out of the office.

## 2024-03-05 NOTE — Telephone Encounter (Signed)
 Copied from CRM [id number]. Topic: Clinical - Red Word Triage >> Mar 05, 2024  8:29 AM Nathanel DEL wrote: Red Word that prompted transfer to Nurse Triage: pt instructed to go to ED last week.  She is still feeling dizzy, lightheaded.  Breathing more heavily  something is not right. >> Mar 05, 2024 11:15 AM [place] J wrote: Patient was calling because she was confused as to why Idell Butler sent a message recommending her to go to the ER. She would like a call back with clarification.

## 2024-03-06 NOTE — Telephone Encounter (Signed)
 Patient informed of the message below.

## 2024-03-08 ENCOUNTER — Telehealth: Payer: Self-pay | Admitting: Family Medicine

## 2024-03-08 NOTE — Telephone Encounter (Signed)
 Copied from CRM [id number]. Topic: Referral - Question >> Mar 08, 2024  9:11 AM Montie POUR wrote: Reason for CRM:  Angela Daniels called Vascular Surgery to set up an appointment. 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 Please call Denise to discuss referral. She wants to know what to do next. Please call her at [phone].

## 2024-03-09 ENCOUNTER — Ambulatory Visit: Payer: Medicare Other | Admitting: Family Medicine

## 2024-03-12 ENCOUNTER — Encounter: Payer: Self-pay | Admitting: Family Medicine

## 2024-03-12 ENCOUNTER — Ambulatory Visit: Admitting: Family Medicine

## 2024-03-12 ENCOUNTER — Encounter: Payer: Self-pay | Admitting: Student

## 2024-03-12 ENCOUNTER — Ambulatory Visit: Attending: Student | Admitting: Student

## 2024-03-12 ENCOUNTER — Ambulatory Visit (INDEPENDENT_AMBULATORY_CARE_PROVIDER_SITE_OTHER): Admitting: Family Medicine

## 2024-03-12 VITALS — BP 122/81 | HR 95 | Ht 70.0 in | Wt 170.0 lb

## 2024-03-12 VITALS — BP 130/88 | HR 80 | Temp 98.0°F | Ht 70.0 in | Wt 171.9 lb

## 2024-03-12 DIAGNOSIS — I2510 Atherosclerotic heart disease of native coronary artery without angina pectoris: Secondary | ICD-10-CM | POA: Insufficient documentation

## 2024-03-12 DIAGNOSIS — I071 Rheumatic tricuspid insufficiency: Secondary | ICD-10-CM | POA: Diagnosis present

## 2024-03-12 DIAGNOSIS — I82811 Embolism and thrombosis of superficial veins of right lower extremities: Secondary | ICD-10-CM

## 2024-03-12 DIAGNOSIS — R42 Dizziness and giddiness: Secondary | ICD-10-CM | POA: Insufficient documentation

## 2024-03-12 DIAGNOSIS — H6122 Impacted cerumen, left ear: Secondary | ICD-10-CM | POA: Diagnosis not present

## 2024-03-12 DIAGNOSIS — E785 Hyperlipidemia, unspecified: Secondary | ICD-10-CM | POA: Diagnosis present

## 2024-03-12 DIAGNOSIS — I340 Nonrheumatic mitral (valve) insufficiency: Secondary | ICD-10-CM | POA: Diagnosis present

## 2024-03-12 DIAGNOSIS — R55 Syncope and collapse: Secondary | ICD-10-CM | POA: Diagnosis not present

## 2024-03-12 DIAGNOSIS — I10 Essential (primary) hypertension: Secondary | ICD-10-CM | POA: Diagnosis present

## 2024-03-12 DIAGNOSIS — I471 Supraventricular tachycardia, unspecified: Secondary | ICD-10-CM | POA: Insufficient documentation

## 2024-03-12 DIAGNOSIS — R2689 Other abnormalities of gait and mobility: Secondary | ICD-10-CM

## 2024-03-12 DIAGNOSIS — I8390 Asymptomatic varicose veins of unspecified lower extremity: Secondary | ICD-10-CM

## 2024-03-12 MED ORDER — APIXABAN 5 MG PO TABS: 5.0000 mg | ORAL_TABLET | Freq: Two times a day (BID) | ORAL | 1 refills | Status: DC

## 2024-03-12 MED ORDER — ROSUVASTATIN CALCIUM 10 MG PO TABS: 5.0000 mg | ORAL_TABLET | Freq: Every day | ORAL | Status: DC

## 2024-03-12 NOTE — Patient Instructions (Addendum)
 Medication Instructions:  Start Rosuvastatin  (Crestor ) 5mg  daily. *If you need a refill on your cardiac medications before your next appointment, please call your pharmacy*   Lab Work: Lipid panel and LFTs in 3 months after starting Rosuvastatin  (Crestor ). If you have labs (blood work) drawn today and your tests are completely normal, you will receive your results only by: MyChart Message (if you have MyChart) OR A paper copy in the mail If you have any lab test that is abnormal or we need to change your treatment, we will call you to review the results.   Testing/Procedures: None.   Follow-Up: At [facility], you and your health needs are our priority.  As part of our continuing mission to provide you with exceptional heart care, we have created designated Provider Care Teams.  These Care Teams include your primary Cardiologist (physician) and Advanced Practice Providers (APPs -  Physician Assistants and Nurse Practitioners) who all work together to provide you with the care you need, when you need it.  We recommend signing up for the patient portal called MyChart.  Sign up information is provided on this After Visit Summary.  MyChart is used to connect with patients for Virtual Visits (Telemedicine).  Patients are able to view lab/test results, encounter notes, upcoming appointments, etc.  Non-urgent messages can be sent to your provider as well.   To learn more about what you can do with MyChart, go to [email or web address].    Your next appointment:   06/12/2024 at 11:20am with Angela Cowdery, PA-C

## 2024-03-12 NOTE — Progress Notes (Signed)
 Established Patient Office Visit  Subjective   Patient ID: Jasmyne Lodato, female    DOB: May 22, 1946  Age: 78 y.o. MRN: [id number]  Chief Complaint  Patient presents with   Cerumen Impaction    Pt is here to look in her ears to see if she needed to have them cleaned.   Pt reports that the vascular center would not see her for the diagnosis of superficial chronic venous thrombosis, states she did ask about changing the diagnosis to varicose veins so I will place a new referral.   Pt states that she saw the cardiologist and she was told her symptoms are not coming from her heart. States that with her dizziness there does feel like there is a motion component to it. States that her symptom is still there and is happening daily.     Current Outpatient Medications  Medication Instructions   apixaban  (ELIQUIS ) 5 mg, Oral, 2 times daily   cholecalciferol (VITAMIN D3) 25 MCG (1000 UNIT) tablet Daily   denosumab  (PROLIA ) 60 mg, Every 6 months   diltiazem  (CARDIZEM  CD) 120 mg, Oral, Daily   diphenhydrAMINE  (BENADRYL ) 50 MG capsule Take one capsule 1 hour prior to scan.   hydrALAZINE  (APRESOLINE ) 10 mg, Oral, Every 8 hours PRN   loratadine (CLARITIN) 10 mg, Daily   rosuvastatin  (CRESTOR ) 5 mg, Oral, Daily      Review of Systems  All other systems reviewed and are negative.     Objective:     BP 130/88   Pulse 80   Temp 98 F (36.7 C) (Oral)   Ht 5' 10 (1.778 m)   Wt 171 lb 14.4 oz (78 kg)   SpO2 98%   BMI 24.67 kg/m    Physical Exam Vitals reviewed.  Constitutional:      Appearance: Normal appearance. She is normal weight.  HENT:     Right Ear: Tympanic membrane normal.     Left Ear: There is impacted cerumen.     Ears:     Comments: Mild cerumen in the right EAC    Mouth/Throat:     Mouth: Mucous membranes are moist.  Eyes:     Conjunctiva/sclera: Conjunctivae normal.  Pulmonary:     Effort: Pulmonary effort is normal.  Neurological:     Mental Status:  She is alert and oriented to person, place, and time.    Impacted Cerumen Irrigation Procedure Note Patient had impacted cerumen based on my evaluation with otoscope.  Irrigation was recommended for both canals.  Verbal consent was obtained.  The affected auditory canal(s) were then irrigated with successful removal of impacted cerumen as confirmed on post procedural repeat otoscopy. Manual debridement was also required of the remaining cerumen with a lighted curette. Patient tolerated well without complications.     No results found for any visits on 03/12/24.    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  Dispense: 60 tablet; Refill: 1   Pt tolerated cerumen removal well. Patient is requesting referral to  neurologist for the continued balance/ dizziness problem, will also order carotid artery duplex to rule out carotid artery stenosis as a cause of her presyncopal/ dizziness.   No follow-ups on file.    Heron CHRISTELLA Sharper, MD

## 2024-03-13 ENCOUNTER — Ambulatory Visit: Admitting: Family Medicine

## 2024-03-13 ENCOUNTER — Telehealth: Payer: Self-pay | Admitting: Family Medicine

## 2024-03-13 NOTE — Telephone Encounter (Signed)
 Yes she can.

## 2024-03-13 NOTE — Telephone Encounter (Unsigned)
 Copied from CRM [id number]. Topic: Appointments - Scheduling Inquiry for Clinic >> Mar 13, 2024  4:19 PM Chasity T wrote: Reason for CRM: Patient gets her prolia  shot every 6 months and wants to know if she can receive it at or around her appointment with Dr Ozell in November. Please contact her regarding the appointment.

## 2024-03-14 ENCOUNTER — Inpatient Hospital Stay
Admission: RE | Admit: 2024-03-14 | Discharge: 2024-03-14 | Discharge status: Home / Self Care | Source: Ambulatory Visit | Attending: Family Medicine

## 2024-03-14 ENCOUNTER — Encounter: Payer: Self-pay | Admitting: Family Medicine

## 2024-03-14 DIAGNOSIS — R42 Dizziness and giddiness: Secondary | ICD-10-CM

## 2024-03-14 DIAGNOSIS — R2689 Other abnormalities of gait and mobility: Secondary | ICD-10-CM

## 2024-03-14 NOTE — Telephone Encounter (Signed)
 Left a detailed message with the information below at the patient's cell number.

## 2024-03-15 ENCOUNTER — Ambulatory Visit (INDEPENDENT_AMBULATORY_CARE_PROVIDER_SITE_OTHER): Admitting: Neurology

## 2024-03-15 ENCOUNTER — Encounter: Payer: Self-pay | Admitting: Neurology

## 2024-03-15 VITALS — BP 144/91 | HR 108 | Resp 16 | Ht 69.75 in | Wt 171.5 lb

## 2024-03-15 DIAGNOSIS — F419 Anxiety disorder, unspecified: Secondary | ICD-10-CM | POA: Diagnosis not present

## 2024-03-15 DIAGNOSIS — R4189 Other symptoms and signs involving cognitive functions and awareness: Secondary | ICD-10-CM

## 2024-03-15 DIAGNOSIS — R55 Syncope and collapse: Secondary | ICD-10-CM | POA: Diagnosis not present

## 2024-03-15 NOTE — Telephone Encounter (Signed)
 Ok to send referral to ENT for the above complaints

## 2024-03-15 NOTE — Progress Notes (Signed)
 [facility]  PATIENT: Angela Daniels DOB: 1945-09-02  REQUESTING CLINICIAN: Ozell Heron HERO, MD HISTORY FROM: Patient  REASON FOR VISIT: Dizziness/Brain fog   HISTORICAL  CHIEF COMPLAINT:  Chief Complaint  Patient presents with   New Patient (Initial Visit)    Rm12, alone,  Internal referral for dizziness and balance issues: orthostatic bp completed. Pt stated having dizziness: denied nausea, balance problems: unsteady, almost fainted before, delayed reactions, feeling shaky/weakness, brain fog, tinnitus, fullness/heaviness in head, chest pressure: doing better. Pt stated that all of this came around feb and its constant with varying intensity.      HISTORY OF PRESENT ILLNESS:  This is a 78 year old woman with hypertension, hyperlipidemia, new superficial DVT, anxiety  who is presenting with complaints of near syncope, chest tightness and brain fog.  Patient reports episodes started back in January when she was experience chest tightness. She presented to the ED in February, her work up has been unrevealing. She reports at that time, she just lost he Husband and moved from [place]  to [place] to be close to family.  She continues to experience dizziness, near syncope, brain fog even though she recognized that her symptoms are improving.  In July, she presented to ED for a near syncopal episode and her CT head did not show any acute abnormalities. She tells me that she is very active, plays tennis, her blood pressure at home are normal but she thinks that she has white coat syndrome . She also reports some jitteriness and tingling in her fingers. Her family has mentioned that her symptoms might be related to increase stress  She denies any new headaches, no changes in vision, no new weakness, numbness and no falls.    OTHER MEDICAL CONDITIONS: Hypertension, Hyperlipidemia, recent DVTAnxiety    REVIEW OF SYSTEMS: Full 14 system review of systems performed  and negative with exception of: As noted in the HPI   ALLERGIES: Allergies  Allergen Reactions   Shellfish Allergy Shortness Of Breath   Iodine     Severe burning sensation   Latex     Cannot tolerate for long periods of time.   Sulfa Antibiotics     HOME MEDICATIONS: Outpatient Medications Prior to Visit  Medication Sig Dispense Refill   apixaban  (ELIQUIS ) 5 MG TABS tablet Take 1 tablet (5 mg total) by mouth 2 (two) times daily. 60 tablet 1   cholecalciferol (VITAMIN D3) 25 MCG (1000 UNIT) tablet Take by mouth daily.     denosumab  (PROLIA ) 60 MG/ML SOSY injection Inject 60 mg into the skin every 6 (six) months.     diltiazem  (CARDIZEM  CD) 120 MG 24 hr capsule Take 1 capsule (120 mg total) by mouth daily. 90 capsule 3   diphenhydrAMINE  (BENADRYL ) 50 MG capsule Take one capsule 1 hour prior to scan. 1 capsule 0   hydrALAZINE  (APRESOLINE ) 10 MG tablet Take 1 tablet (10 mg total) by mouth every 8 (eight) hours as needed (light headedness, dizziness, with a bp above 155/90). 30 tablet 0   loratadine (CLARITIN) 10 MG tablet Take 10 mg by mouth daily.     rosuvastatin  (CRESTOR ) 10 MG tablet Take 0.5 tablets (5 mg total) by mouth daily.     Facility-Administered Medications Prior to Visit  Medication Dose Route Frequency Provider Last Rate Last Admin   denosumab  (PROLIA ) injection 60 mg  60 mg Subcutaneous Once Ozell Heron HERO, MD        PAST MEDICAL HISTORY: Past Medical History:  Diagnosis  Date   Allergy    Arthritis    Hypertension    07/20/23   Osteoporosis    Positive TB test    Thoracic aortic aneurysm (HCC)     PAST SURGICAL HISTORY: Past Surgical History:  Procedure Laterality Date   TONSILLECTOMY     1954    FAMILY HISTORY: Family History  Problem Relation Age of Onset   Hyperlipidemia Mother    Hypertension Mother    Hearing loss Mother    Kidney disease Father    Cancer Father        Bladder cancer   Hypertension Sister    Hypertension Brother     Cancer Brother     SOCIAL HISTORY: Social History   Socioeconomic History   Marital status: Widowed    Spouse name: Not on file   Number of children: 2   Years of education: Not on file   Highest education level: Bachelor's degree (e.g., BA, AB, BS)  Occupational History   Occupation: Retired Runner, broadcasting/film/video  Tobacco Use   Smoking status: Never   Smokeless tobacco: Never  Vaping Use   Vaping status: Never Used  Substance and Sexual Activity   Alcohol use: Yes    Comment: occasionally   Drug use: Never   Sexual activity: Not Currently  Other Topics Concern   Not on file  Social History Narrative   Not on file   Social Drivers of Health   Financial Resource Strain: Low Risk  (02/21/2024)   Overall Financial Resource Strain (CARDIA)    Difficulty of Paying Living Expenses: Not hard at all  Food Insecurity: No Food Insecurity (02/21/2024)   Hunger Vital Sign    Worried About Running Out of Food in the Last Year: Never true    Ran Out of Food in the Last Year: Never true  Transportation Needs: No Transportation Needs (02/21/2024)   PRAPARE - Administrator, Civil Service (Medical): No    Lack of Transportation (Non-Medical): No  Physical Activity: Sufficiently Active (02/21/2024)   Exercise Vital Sign    Days of Exercise per Week: 4 days    Minutes of Exercise per Session: 60 min  Stress: Patient Declined (09/09/2023)   [employer] of Occupational Health - Occupational Stress Questionnaire    Feeling of Stress : Patient declined  Social Connections: Unknown (02/21/2024)   Social Connection and Isolation Panel    Frequency of Communication with Friends and Family: Not on file    Frequency of Social Gatherings with Friends and Family: Not on file    Attends Religious Services: Not on file    Active Member of Clubs or Organizations: Yes    Attends Banker Meetings: More than 4 times per year    Marital Status: Widowed  Intimate Partner Violence: Not At Risk  (09/09/2023)   Humiliation, Afraid, Rape, and Kick questionnaire    Fear of Current or Ex-Partner: No    Emotionally Abused: No    Physically Abused: No    Sexually Abused: No    PHYSICAL EXAM  GENERAL EXAM/CONSTITUTIONAL: Vitals:  Vitals:   03/15/24 1345 03/15/24 1348 03/15/24 1352  BP: (!) 166/85 (!) 153/103 (!) 144/91  Pulse: 91 (!) 112 (!) 108  Resp: 16    SpO2: 97% 95% 97%  Weight: 171 lb 8 oz (77.8 kg)    Height: 5' 9.75 (1.772 m)     Body mass index is 24.78 kg/m. Wt Readings from Last 3 Encounters:  03/15/24 171  lb 8 oz (77.8 kg)  03/12/24 171 lb 14.4 oz (78 kg)  03/12/24 170 lb (77.1 kg)   Patient is in no distress; well developed, nourished and groomed; neck is supple  MUSCULOSKELETAL: Gait, strength, tone, movements noted in Neurologic exam below  NEUROLOGIC: MENTAL STATUS:      No data to display         awake, alert, oriented to person, place and time recent and remote memory intact normal attention and concentration language fluent, comprehension intact, naming intact fund of knowledge appropriate  CRANIAL NERVE:  2nd, 3rd, 4th, 6th - Visual fields full to confrontation, extraocular muscles intact, no nystagmus 5th - facial sensation symmetric 7th - facial strength symmetric 8th - hearing intact 9th - palate elevates symmetrically, uvula midline 11th - shoulder shrug symmetric 12th - tongue protrusion midline  MOTOR:  normal bulk and tone, full strength in the BUE, BLE  SENSORY:  normal and symmetric to light touch  COORDINATION:  finger-nose-finger, fine finger movements normal  GAIT/STATION:  normal   DIAGNOSTIC DATA (LABS, IMAGING, TESTING) - I reviewed patient records, labs, notes, testing and imaging myself where available.  Lab Results  Component Value Date   WBC 6.8 02/22/2024   HGB 14.0 02/22/2024   HCT 40.8 02/22/2024   MCV 90.1 02/22/2024   PLT 207 02/22/2024      Component Value Date/Time   NA 136 02/22/2024  1723   K 4.0 02/22/2024 1723   CL 99 02/22/2024 1723   CO2 26 02/22/2024 1723   GLUCOSE 88 02/22/2024 1723   BUN 9 02/22/2024 1723   CREATININE 0.69 02/22/2024 1723   CALCIUM  9.2 02/22/2024 1723   PROT 8.7 (H) 02/22/2024 1839   ALBUMIN 4.9 02/22/2024 1839   AST 34 02/22/2024 1839   ALT 20 02/22/2024 1839   ALKPHOS 64 02/22/2024 1839   BILITOT 0.5 02/22/2024 1839   GFRNONAA >60 02/22/2024 1723   GFRAA >60 02/26/2020 1626   Lab Results  Component Value Date   CHOL 201 (H) 06/23/2023   HDL 63.20 06/23/2023   LDLCALC 123 (H) 06/23/2023   TRIG 73.0 06/23/2023   CHOLHDL 3 06/23/2023   No results found for: HGBA1C Lab Results  Component Value Date   VITAMINB12 377 06/03/2023   Lab Results  Component Value Date   TSH 1.83 02/22/2024    CT head 79/2025 1. No acute intracranial CT findings. 2. Age related cerebral cortical volume loss and mild small-vessel disease.    ASSESSMENT AND PLAN  78 y.o. year old female with medical history including hypertension, hyperlipidemia, recent DVT on Eliquis , increase anxiety/stress who is presenting with multiple complaints including near syncope, chest pain, brain fog, tingling in fingers. She has a normal neurological examination and normal brain scan. She does reports some increase stress since the death of her husband and moving from [place]  to [place]. I have explained to the patient that based on examination and images, I cannot find a neurological explanation of her symptoms, informed her that her presentation is not typical for lupus or MS (as she inquired if this is MS or Lupus) but this can be seen in patient with increase stress and anxiety. Plan will for referral to psychology for evaluation and management of stress. She is comfortable with plans. Continue to follow up with PCP.    1. Near syncope   2. Brain fog   3. Anxiety     Patient Instructions  Continue current medications  Continue to follow up with  PCP  Referral to  psychology for management of anxiety/stress Return as needed   Orders Placed This Encounter  Procedures   Ambulatory referral to Psychology    No orders of the defined types were placed in this encounter.   Return if symptoms worsen or fail to improve.   Pastor Falling, MD 03/18/2024, 7:34 PM  [facility] [address], [address] [place], [place] [address] [phone]

## 2024-03-18 NOTE — Patient Instructions (Signed)
 Continue current medications  Continue to follow up with PCP  Referral to psychology for management of anxiety/stress Return as needed

## 2024-03-19 ENCOUNTER — Ambulatory Visit: Payer: Self-pay | Admitting: Family Medicine

## 2024-03-19 ENCOUNTER — Telehealth: Payer: Self-pay | Admitting: Student

## 2024-03-19 ENCOUNTER — Telehealth: Payer: Self-pay | Admitting: Neurology

## 2024-03-19 NOTE — Telephone Encounter (Signed)
 Her systolic BP dropped from 166 to 144 and her diastolic BP dropped from 103 to 91  which does technically meet criteria for orthostatic hypotension (but just slightly). However, her BP is elevated at baseline. Given she is having orthostatic hypotension with symptoms, we may have to allow for some degree of permissive hypertension. She can continue to take Hydralazine  10mg  as needed for elevated BP but I would probably change parameters to PRN for BP >160/100.  I agree with conservative management of her orthostatic hypotension (staying well hydrated, changing positions slowly, thigh high compression stockings, and elevated legs as much as possible).  Thank you!

## 2024-03-19 NOTE — Telephone Encounter (Signed)
 Pt called in to ask Callie, PA if there are any tests she can have that will test her bp when she changes positions. Please advise.

## 2024-03-19 NOTE — Telephone Encounter (Signed)
 Referral for Psychology faxed to [facility]    [facility]  Phone: [phone] [phone]

## 2024-03-19 NOTE — Telephone Encounter (Signed)
 Spoke with the patient who states that she saw a neurologist last week and overall her workup was normal. The neurologist did not seem to think any of her symptoms were neuro related. Patient states that they took her BP in different positions and that got her concerned. See results below.  BP: (!) 166/85 (!) 153/103 (!) 144/91  Pulse: 91 (!) 112 (!) 108   Patient states that she is concerned that position changes might be causing some of her issues. Patient does report that she does get dizzy with position changes sometimes. Patient also concerned about elevated diastolic readings that she has been getting. She states that after a walk yesterday her BP was 126/114. Patient inquiring about further workup for orthostatic hypotension. Advised that there is typically no further workup and that it appears her blood pressure is not dropping with positions changes. Heart rate does increase slightly. Advised patient she could try and wear compression hose to see if that helps with any of her symptoms. Patient has multiple other concerns that I advised were not cardiac related. She does have upcoming appointment with a vascular specialist and a pulmonologist. Will make Callie aware of patient's concerns.

## 2024-03-20 MED ORDER — HYDRALAZINE HCL 10 MG PO TABS: 10.0000 mg | ORAL_TABLET | Freq: Three times a day (TID) | ORAL | Status: AC | PRN

## 2024-03-20 NOTE — Telephone Encounter (Signed)
 Patient notified.  She reports BP  is usually decent at home. Is elevated when at dentist and doctor's office.  She will continue to check at home.  I advised her not to check several times per day as this could cause elevated readings.  Patient reports she still feels off balance at times.  Felt off balance during her 40 minute walk yesterday.  She has upcoming appointments with pulmonary, ENT and vascular.

## 2024-03-26 ENCOUNTER — Encounter: Payer: Self-pay | Admitting: Family Medicine

## 2024-03-26 ENCOUNTER — Ambulatory Visit (INDEPENDENT_AMBULATORY_CARE_PROVIDER_SITE_OTHER): Admitting: Family Medicine

## 2024-03-26 VITALS — BP 142/80 | HR 81 | Temp 97.9°F | Ht 69.75 in | Wt 173.7 lb

## 2024-03-26 DIAGNOSIS — I10 Essential (primary) hypertension: Secondary | ICD-10-CM

## 2024-03-26 MED ORDER — OLMESARTAN MEDOXOMIL 20 MG PO TABS
20.0000 mg | ORAL_TABLET | Freq: Every day | ORAL | 3 refills | Status: DC
Start: 2024-03-26 — End: 2024-05-18

## 2024-03-26 NOTE — Progress Notes (Signed)
 Established Patient Office Visit  Subjective   Patient ID: Angela Daniels, female    DOB: 1946/05/19  Age: 78 y.o. MRN: [id number]  Chief Complaint  Patient presents with   Medical Management of Chronic Issues    Patient requests visit to discuss elevated BP, dizziness and balance problems    Pt is here for follow up, states she saw the neurologist and she was told it wasn't neurologic, states that he suggested that it is possibly psychosomatic, that certain feelings maybe triggering the physical symptoms. States that they did orthostatic blood pressures and they did drop but they were all elevated at their office. I reviewed her BP readings and she did drop >20 mmHg and she was told she had orthostatic hypotension, however patient states that her dizziness is always there and is not really related to her positional changes. Patient states she would like to try the     Current Outpatient Medications  Medication Instructions   apixaban  (ELIQUIS ) 5 mg, Oral, 2 times daily   cholecalciferol (VITAMIN D3) 25 MCG (1000 UNIT) tablet Daily   denosumab  (PROLIA ) 60 mg, Every 6 months   diphenhydrAMINE  (BENADRYL ) 50 MG capsule Take one capsule 1 hour prior to scan.   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     Objective:     BP (!) 142/80   Pulse 81   Temp 97.9 F (36.6 C) (Oral)   Ht 5' 9.75 (1.772 m)   Wt 173 lb 11.2 oz  (78.8 kg)   SpO2 98%   BMI 25.10 kg/m    Physical Exam Vitals reviewed.  Constitutional:      Appearance: Normal appearance. She is normal weight.  Eyes:     Conjunctiva/sclera: Conjunctivae normal.  Pulmonary:     Effort: Pulmonary effort is normal.  Musculoskeletal:     Right lower leg: No edema.     Left lower leg: No edema.  Neurological:     Mental Status: She is alert and oriented to person, place, and time. Mental status is at baseline.  Psychiatric:        Mood and Affect: Mood normal.        Behavior: Behavior normal.      No results found for any visits on 03/26/24.    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 Will switch patient to olmesartan  20 mg daily and she will stop the diltiazem . She will send me her BP readings in 2 weeks.   Orders: -     Olmesartan  Medoxomil; Take 1 tablet (20 mg total) by mouth daily.  Dispense: 30 tablet; Refill: 3   Pt will see me in November at her regular appointment.   No follow-ups on file.    Heron CHRISTELLA Sharper, MD

## 2024-03-26 NOTE — Assessment & Plan Note (Signed)
 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 Will switch patient to olmesartan  20 mg daily and she will stop the diltiazem . She will send me her BP readings in 2 weeks.

## 2024-04-04 ENCOUNTER — Ambulatory Visit: Admitting: Dermatology

## 2024-04-04 ENCOUNTER — Encounter: Payer: Self-pay | Admitting: Family Medicine

## 2024-04-04 DIAGNOSIS — R2689 Other abnormalities of gait and mobility: Secondary | ICD-10-CM

## 2024-04-05 ENCOUNTER — Encounter: Payer: Self-pay | Admitting: Internal Medicine

## 2024-04-05 ENCOUNTER — Ambulatory Visit: Admitting: Internal Medicine

## 2024-04-05 VITALS — BP 146/84 | HR 90 | Ht 70.0 in | Wt 174.0 lb

## 2024-04-05 DIAGNOSIS — Z831 Family history of other infectious and parasitic diseases: Secondary | ICD-10-CM

## 2024-04-05 DIAGNOSIS — R002 Palpitations: Secondary | ICD-10-CM

## 2024-04-05 DIAGNOSIS — R42 Dizziness and giddiness: Secondary | ICD-10-CM

## 2024-04-05 DIAGNOSIS — Z872 Personal history of diseases of the skin and subcutaneous tissue: Secondary | ICD-10-CM

## 2024-04-05 DIAGNOSIS — R0609 Other forms of dyspnea: Secondary | ICD-10-CM

## 2024-04-05 DIAGNOSIS — R55 Syncope and collapse: Secondary | ICD-10-CM

## 2024-04-05 DIAGNOSIS — J479 Bronchiectasis, uncomplicated: Secondary | ICD-10-CM

## 2024-04-05 LAB — CBC WITH DIFFERENTIAL/PLATELET
Basophils Absolute: 0 K/uL (ref 0.0–0.1)
Basophils Relative: 0.6 % (ref 0.0–3.0)
Eosinophils Absolute: 0 K/uL (ref 0.0–0.7)
Eosinophils Relative: 0.6 % (ref 0.0–5.0)
HCT: 42.6 % (ref 36.0–46.0)
Hemoglobin: 14.3 g/dL (ref 12.0–15.0)
Lymphocytes Relative: 31.4 % (ref 12.0–46.0)
Lymphs Abs: 1.6 K/uL (ref 0.7–4.0)
MCHC: 33.6 g/dL (ref 30.0–36.0)
MCV: 90.7 fl (ref 78.0–100.0)
Monocytes Absolute: 0.3 K/uL (ref 0.1–1.0)
Monocytes Relative: 6.4 % (ref 3.0–12.0)
Neutro Abs: 3.1 K/uL (ref 1.4–7.7)
Neutrophils Relative %: 61 % (ref 43.0–77.0)
Platelets: 248 K/uL (ref 150.0–400.0)
RBC: 4.7 Mil/uL (ref 3.87–5.11)
RDW: 12.8 % (ref 11.5–15.5)
WBC: 5.1 K/uL (ref 4.0–10.5)

## 2024-04-05 NOTE — Progress Notes (Signed)
 OV 04/05/2024  Subjective:  Patient ID: Angela Daniels, female , DOB: 1946-05-15 , age 78 y.o. , MRN: [id number] , ADDRESS: [address] [place] [address] PCP Ozell Heron HERO, MD Patient Care Team: Ozell Heron HERO, MD as PCP - General (Family Medicine) Verlin Lonni BIRCH, MD as PCP - Cardiology (Cardiology)  This Provider for this visit: Treatment Team:  Attending Provider: Mannam, Praveen, MD    04/05/2024 -   Chief Complaint  Patient presents with   Consult    Referred by Dr. Heron Ozell for eval of bronchiectasis. She c/o DOE over the past year. She states she is unable to do anything like she used to- playing tennis, gardening, exercise. She has dizziness and feels off balance.      HPI Angela Daniels 78 y.o. -referred by primary care physician for evaluation of bronchiectasis bilateral mid zone incidental findings.  78 year old lady who is originally from [place]  but then retired to [place]  with her husband who worked in [employer].  He ultimately passed away in Jul 06, 2024after suffering from an aggressive form of prostate cancer.  She says up until 2 summers ago she was actively playing tennis but after her husband's illness the tennis went down but she was still active and kept herself physically fit.  After her husband passed away she relocated to [facility]  in September 2024 to be with her son and grandson.  Here she is continue to be active and fit but more recently has gotten even more active getting back into tennis [she plays at the [employer] level 3.5].  She is also been doing gardening.  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  But otherwise quite healthy.  She was originally born in [place] the country.  That she got exposed to her sister who had tuberculosis.  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  Review of the records indicate that in March 2025 she did have echocardiogram she also reported ZIO monitoring all of this was normal. Against this backdrop, on February 14, 2024 she played tennis and felt great.  The next day also did some gardening and then abruptly on February 16, 2024 she had a near syncopal episode and nearly fell but actually did not fall.  Her blood pressure was okay but since then she started having progressive symptoms of feeling poorly.  She was then referred to the ER where she had CT scan of the chest head all of which was normal except for bilateral mid zone bronchiectasis [personally visualized and confirmed] but she has never had cough she still does not have cough.  CT angiogram chest was negative in the ED for pulm embolism.  However she is continue to have new onset shortness of breath since February 16, 2024 along with dizziness.  She is also reporting rib pressure.  The symptoms are progressive.  Dyspnea is class III.  She is unable to play tennis.  She is also having balance issues.  Sit/stand test here is normal except she got tachycardic.         SIT STAND TEST - goal 15 times   04/05/2024    O2 used ra   PRobe - finter or forehead finger   Number sit and stand completed - goal 15 15   Time taken to complete 52  sec   Resting Pulse Ox/HR/Dyspnea  100% and 85/min and dyspnea of 1/10    Peak measures 99 % and 113/min and dyspnea of 1/10   Final Pulse Ox/HR 99% and 102/min and dyspnea of 1/10   Desaturated </= 88% no   Desaturated <= 3% points no   Got Tachycardic >/= 90/min yes   Miscellaneous comments Minimal dyspnea      CT Chest data from date: February 22 2024  - personally visualized and independently interpreted : yes - my findings are: as below  Mediastinum/Nodes: No enlarged mediastinal, hilar, or axillary lymph nodes. Thyroid  gland, trachea, and esophagus demonstrate no significant findings. There is a small hiatal hernia.   Lungs/Pleura: In  the anteromedial base of the lingula and right middle lobe there is chronic scarring change, bronchiectasis and bronchial impactions consistent with a chronic inflammatory/infectious process such as MAC.   There are linear scar-like opacities in the lower lobes, outer right middle lobe.   There is biapical reticulated scarring and scattered calcifications. There is no acute consolidation or pleural effusion. Central airways are clear.   Mild elevation again noted right hemidiaphragm the with overlying hazy atelectasis.   There is a calcified granuloma medially in the right lower lobe. No noncalcified nodules are seen.     Latest Reference Range & Units 02/26/20 16:26 10/13/23 19:04 02/22/24 12:11 02/22/24 17:23  Hemoglobin 12.0 - 15.0 g/dL 85.8 85.4 85.2 85.9      has a past medical history of Allergy , Arthritis, Hypertension, Osteoporosis, Positive TB test, and Thoracic aortic aneurysm (HCC).   reports that she has never smoked. She has never used smokeless tobacco.  Past Surgical History:  Procedure Laterality Date   TONSILLECTOMY     1954    Allergies  Allergen Reactions   Shellfish Allergy  Shortness Of Breath   Iodine     Severe burning sensation   Latex     Cannot tolerate for long periods of time.   Sulfa Antibiotics     Immunization History  Administered Date(s) Administered   Fluad Quad(high Dose 65+) 06/04/2021   Fluzone Influenza virus vaccine,trivalent (IIV3), split virus 06/12/2014   Influenza, High Dose Seasonal PF 06/27/2015, 05/24/2016, 06/15/2017, 05/06/2018, 04/17/2019   Influenza,inj,Quad PF,6+ Mos 04/27/2019   Influenza,inj,quad, With Preservative 05/06/2018   Influenza-Unspecified 05/06/2018, 04/17/2019, 05/23/2020, 05/30/2023   Moderna Sars-Covid-2 Vaccination 09/07/2019, 09/08/2019, 10/01/2019, 10/06/2019, 10/29/2019   Pneumococcal Conjugate-13 04/23/2014   Pneumococcal Polysaccharide-23 08/27/2015   Tdap 08/16/2013, 06/12/2014   Zoster  Recombinant(Shingrix) 10/29/2021   Zoster, Live 06/26/2014    Family History  Problem Relation Age of Onset   Hyperlipidemia Mother    Hypertension Mother    Hearing loss Mother    Kidney disease Father    Cancer Father        Bladder cancer   Hypertension Sister    Hypertension Brother    Cancer Brother      Current Outpatient Medications:    apixaban  (ELIQUIS ) 5 MG TABS tablet, Take 1 tablet (5 mg total) by mouth 2 (two) times daily., Disp: 60 tablet, Rfl: 1   cholecalciferol (VITAMIN D3) 25 MCG (1000 UNIT) tablet, Take by mouth daily., Disp: , Rfl:    denosumab  (PROLIA ) 60 MG/ML SOSY injection, Inject 60 mg into the skin every 6 (six) months., Disp: , Rfl:    hydrALAZINE  (APRESOLINE ) 10 MG tablet, Take 1 tablet (10 mg total) by mouth every 8 (eight) hours as needed (light headedness, dizziness with a bp above 160/100)., Disp: ,  Rfl:    olmesartan  (BENICAR ) 20 MG tablet, Take 1 tablet (20 mg total) by mouth daily., Disp: 30 tablet, Rfl: 3   rosuvastatin  (CRESTOR ) 10 MG tablet, Take 0.5 tablets (5 mg total) by mouth daily., Disp: , Rfl:    diphenhydrAMINE  (BENADRYL ) 50 MG capsule, Take one capsule 1 hour prior to scan., Disp: 1 capsule, Rfl: 0  Current Facility-Administered Medications:    denosumab  (PROLIA ) injection 60 mg, 60 mg, Subcutaneous, Once, Ozell Heron HERO, MD      Objective:   Vitals:   04/05/24 1413  BP: (!) 146/84  Pulse: 90  SpO2: 99%  Weight: 174 lb (78.9 kg)  Height: 5' 10 (1.778 m)    Estimated body mass index is 24.97 kg/m as calculated from the following:   Height as of this encounter: 5' 10 (1.778 m).   Weight as of this encounter: 174 lb (78.9 kg).  @WEIGHTCHANGE @  [employer]   04/05/24 1413  Weight: 174 lb (78.9 kg)     Physical Exam   General: No distress. Looks well. PINk  FACE from Rosacea O2 at rest: no Cane present: no Sitting in wheel chair: no Frail: no Obese: no Neuro: Alert and Oriented x 3. GCS 15. Speech  normal Psych: Pleasant Resp:  Barrel Chest - no.  Wheeze - no, Crackles - no, No overt respiratory distress CVS: Normal heart sounds. Murmurs - no Ext: Stigmata of Connective Tissue Disease - no HEENT: Normal upper airway. PEERL +. No post nasal drip        Assessment/     Assessment & Plan Bronchiectasis without complication (HCC)  Family history of tuberculosis  Near syncope  Dyspnea on exertion  Dizziness  Palpitations  History of rosacea    PLAN Patient Instructions     ICD-10-CM   1. Bronchiectasis without complication (HCC)  J47.9     2. Family history of tuberculosis  Z83.1     3. Near syncope  R55     4. Dyspnea on exertion  R06.09     5. Dizziness  R42     6. Palpitations  R00.2     7. History of rosacea  Z87.2      There is bilateral L > R Mid Zone bronchiectasis Unclear at this point if symptoms and this are related  Plan  - get ECHO (last one march 2025 before syncope episode in July 2025)  - talk to cardiology and see if repeat ZIO monitor needs done - get cbc with diff, IgE and RAST allergy  panel - get IgG, igM, igA antibody test - check ANA, RF, ssA, ssB antibody profile - check Quantiferon Gold  = do full PFT - might have to consider bronchscopy with lavage based on results  Followup  - APP or Dr Geronimo in 4-8 weeks to discuss results  - if results all non contributory will have to consider CPST test    FOLLOWUP    Return in about 6 weeks (around 05/17/2024) for Face to Face Visit, with any of the APPS, with Dr Geronimo.    SIGNATURE    Dr. Dorethia Geronimo, M.D., F.C.C.P,  Pulmonary and Critical Care Medicine Staff Physician, [facility] Center Director - Interstitial Lung Disease  Program  Pulmonary Fibrosis [facility] at [facility] [place], [place], [address]  Pager: [phone], If no answer or between  15:00h - 7:00h: call [phone] Telephone: [phone]  6:26  PM 04/05/2024  Moderate Complexity MDM OFFICE  2021 E/M guidelines, first released in 2021, with minor revisions added in 2023 and 2024 Must meet the requirements for 2 out of 3 dimensions to qualify.    Number and complexity of problems addressed Amount and/or complexity of data reviewed Risk of complications and/or morbidity  One or more chronic illness with mild exacerbation, OR progression, OR  side effects of treatment  Two or more stable chronic illnesses  One undiagnosed new problem with uncertain prognosis  One acute illness with systemic symptoms   One Acute complicated injury Must meet the requirements for 1 of 3 of the categories)  Category 1: Tests and documents, historian  Any combination of 3 of the following:  Assessment requiring an independent historian  Review of prior external note(s) from each unique source  Review of results of each unique test  Ordering of each unique test    Category 2: Interpretation of tests   Independent interpretation of a test performed by another physician/other qualified health care professional (not separately reported)  Category 3: Discuss management/tests  Discussion of management or test interpretation with external physician/other qualified health care professional/appropriate source (not separately reported) Moderate risk of morbidity from additional diagnostic testing or treatment Examples only:  Prescription drug management  Decision regarding minor surgery with identfied patient or procedure risk factors  Decision regarding elective major surgery without identified patient or procedure risk factors  Diagnosis or treatment significantly limited by social determinants of health             HIGh Complexity  OFFICE   2021 E/M guidelines, first released in 2021, with minor revisions added in 2023. Must meet the requirements for 2 out of 3 dimensions to qualify.    Number and complexity of problems  addressed Amount and/or complexity of data reviewed Risk of complications and/or morbidity  Severe exacerbation of chronic illness  Acute or chronic illnesses that may pose a threat to life or bodily function, e.g., multiple trauma, acute MI, pulmonary embolus, severe respiratory distress, progressive rheumatoid arthritis, psychiatric illness with potential threat to self or others, peritonitis, acute renal failure, abrupt change in neurological status Must meet the requirements for 2 of 3 of the categories)  Category 1: Tests and documents, historian  Any combination of 3 of the following:  Assessment requiring an independent historian  Review of prior external note(s) from each unique source  Review of results of each unique test  Ordering of each unique test    Category 2: Interpretation of tests    Independent interpretation of a test performed by another physician/other qualified health care professional (not separately reported)  Category 3: Discuss management/tests  Discussion of management or test interpretation with external physician/other qualified health care professional/appropriate source (not separately reported)  HIGH risk of morbidity from additional diagnostic testing or treatment Examples only:  Drug therapy requiring intensive monitoring for toxicity  Decision for elective major surgery with identified pateint or procedure risk factors  Decision regarding hospitalization or escalation of level of care  Decision for DNR or to de-escalate care   Parenteral controlled  substances            LEGEND - Independent interpretation involves the interpretation of a test for which there is a CPT code, and an interpretation or report is customary. When a review and interpretation of a test is performed and documented by the provider, but not separately reported (billed), then this would represent an independent interpretation. This report does not  need to  conform to the usual standards of a complete report of the test. This does not include interpretation of tests that do not have formal reports such as a complete blood count with differential and blood cultures. Examples would include reviewing a chest radiograph and documenting in the medical record an interpretation, but not separately reporting (billing) the interpretation of the chest radiograph.   An appropriate source includes professionals who are not health care professionals but may be involved in the management of the patient, such as a Clinical research associate, upper officer, case manager or teacher, and does not include discussion with family or informal caregivers.    - SDOH: SDOH are the conditions in the environments where people are born, live, learn, work, play, worship, and age that affect a wide range of health, functioning, and quality-of-life outcomes and risks. (e.g., housing, food insecurity, transportation, etc.). SDOH-related Z codes ranging from Z55-Z65 are the ICD-10-CM diagnosis codes used to document SDOH data Z55 - Problems related to education and literacy Z56 - Problems related to employment and unemployment Z57 - Occupational exposure to risk factors Z58 - Problems related to physical environment Z59 - Problems related to housing and economic circumstances [phone] - Problems related to social environment [phone] - Problems related to upbringing [phone] - Other problems related to primary support group, including family circumstances Z63 - Problems related to certain psychosocial circumstances Z65 - Problems related to other psychosocial circumstances

## 2024-04-05 NOTE — Telephone Encounter (Signed)
 Ok to place referral to the above place for Balance disorder

## 2024-04-05 NOTE — Patient Instructions (Addendum)
 ICD-10-CM   1. Bronchiectasis without complication (HCC)  J47.9     2. Family history of tuberculosis  Z83.1     3. Near syncope  R55     4. Dyspnea on exertion  R06.09     5. Dizziness  R42     6. Palpitations  R00.2     7. History of rosacea  Z87.2      There is bilateral L > R Mid Zone bronchiectasis Unclear at this point if symptoms and this are related  Plan  - get ECHO (last one march 2025 before syncope episode in July 2025)  - talk to cardiology and see if repeat ZIO monitor needs done - get cbc with diff, IgE and RAST allergy  panel - get IgG, igM, igA antibody test - check ANA, RF, ssA, ssB antibody profile - check Quantiferon Gold  = do full PFT - might have to consider bronchscopy with lavage based on results  Followup  - APP or Dr Geronimo in 4-8 weeks to discuss results  - if results all non contributory will have to consider CPST test

## 2024-04-06 LAB — RHEUMATOID FACTOR: Rheumatoid fact SerPl-aCnc: <10 [IU]/mL (ref <14)

## 2024-04-10 ENCOUNTER — Ambulatory Visit: Payer: Self-pay | Admitting: Internal Medicine

## 2024-04-10 LAB — RESPIRATORY ALLERGY PANEL REGION II W/ RFLX: ~~LOC~~
Allergen, A. alternata, m6: <0.1 kU/L
Allergen, Cedar tree, t12: <0.1 kU/L
Allergen, Comm Silver Birch, t9: <0.1 kU/L
Allergen, Cottonwood, t14: <0.1 kU/L
Allergen, Mouse Urine Protein, e78: <0.1 kU/L
Allergen, Mulberry, t76: <0.1 kU/L
Allergen, Oak,t7: <0.1 kU/L
Allergen, P. notatum, m1: <0.1 kU/L
Aspergillus fumigatus, m3: <0.1 kU/L
Bermuda Grass: <0.1 kU/L
Box Elder IgE: <0.1 kU/L
CLADOSPORIUM HERBARUM (M2) IGE: <0.1 kU/L
COMMON RAGWEED (SHORT) (W1) IGE: <0.1 kU/L
Cat Dander: <0.1 kU/L
Class: 0
Class: 0
Class: 0
Class: 0
Class: 0
Class: 0
Class: 0
Class: 0
Class: 0
Class: 0
Class: 0
Class: 0
Class: 0
Class: 0
Class: 0
Class: 0
Class: 0
Class: 0
Class: 0
Class: 0
Class: 0
Class: 0
Class: 0
Class: 0
Cockroach: <0.1 kU/L
D. farinae: <0.1 kU/L
Dog Dander: <0.1 kU/L
Elm IgE: <0.1 kU/L
IgE (Immunoglobulin E), Serum: 4 kU/L (ref <=114)
IgE (Immunoglobulin E), Serum: <4 kU/L (ref <=114)
Johnson Grass: <0.1 kU/L
Pecan/Hickory Tree IgE: <0.1 kU/L
Rough Pigweed  IgE: <0.1 kU/L
Sheep Sorrel IgE: <0.1 kU/L
Timothy Grass: <0.1 kU/L

## 2024-04-10 LAB — SJOGREN'S SYNDROME ANTIBODS(SSA + SSB)
SSA (Ro) (ENA) Antibody, IgG: <1 AI
SSB (La) (ENA) Antibody, IgG: <1 AI

## 2024-04-10 LAB — QUANTIFERON-TB GOLD PLUS
Mitogen-NIL: >10 [IU]/mL
NIL: 0.02 [IU]/mL
QuantiFERON-TB Gold Plus: NEGATIVE
TB1-NIL: 0.03 [IU]/mL
TB2-NIL: 0.02 [IU]/mL

## 2024-04-10 LAB — ANTI-NUCLEAR AB-TITER (ANA TITER): ANA Titer 1: 1:160 {titer} — ABNORMAL HIGH

## 2024-04-10 LAB — INTERPRETATION:

## 2024-04-10 LAB — RHEUMATOID FACTOR: Rheumatoid fact SerPl-aCnc: <10 [IU]/mL (ref <14)

## 2024-04-10 LAB — ANA: Anti Nuclear Antibody (ANA): POSITIVE — AB

## 2024-04-10 NOTE — Progress Notes (Signed)
 Outside of slight high ANA (commonn titer level in your age group), no other test is negative. LEt us  wait for other results

## 2024-04-13 NOTE — Telephone Encounter (Signed)
 FYI

## 2024-04-13 NOTE — Telephone Encounter (Signed)
 I used the allergy  test as a good screening tool.  I personally do not know if there are differences in that testing for people from [place]  versus [place] .  That is a little bit too complex for me and if she is curious about that then she would need to go and see an allergist

## 2024-04-18 ENCOUNTER — Ambulatory Visit (HOSPITAL_COMMUNITY)
Admission: RE | Admit: 2024-04-18 | Discharge: 2024-04-18 | Disposition: A | Discharge status: Home / Self Care | Source: Ambulatory Visit | Attending: Internal Medicine | Admitting: Internal Medicine

## 2024-04-18 DIAGNOSIS — I3139 Other pericardial effusion (noninflammatory): Secondary | ICD-10-CM | POA: Insufficient documentation

## 2024-04-18 DIAGNOSIS — R42 Dizziness and giddiness: Secondary | ICD-10-CM

## 2024-04-18 DIAGNOSIS — I361 Nonrheumatic tricuspid (valve) insufficiency: Secondary | ICD-10-CM | POA: Diagnosis not present

## 2024-04-18 DIAGNOSIS — J479 Bronchiectasis, uncomplicated: Secondary | ICD-10-CM | POA: Diagnosis not present

## 2024-04-18 DIAGNOSIS — I119 Hypertensive heart disease without heart failure: Secondary | ICD-10-CM | POA: Insufficient documentation

## 2024-04-18 DIAGNOSIS — I340 Nonrheumatic mitral (valve) insufficiency: Secondary | ICD-10-CM | POA: Diagnosis not present

## 2024-04-18 DIAGNOSIS — Z831 Family history of other infectious and parasitic diseases: Secondary | ICD-10-CM | POA: Diagnosis not present

## 2024-04-18 DIAGNOSIS — R002 Palpitations: Secondary | ICD-10-CM

## 2024-04-18 DIAGNOSIS — R0609 Other forms of dyspnea: Secondary | ICD-10-CM

## 2024-04-18 DIAGNOSIS — R55 Syncope and collapse: Secondary | ICD-10-CM

## 2024-04-18 DIAGNOSIS — Z872 Personal history of diseases of the skin and subcutaneous tissue: Secondary | ICD-10-CM | POA: Diagnosis not present

## 2024-04-18 LAB — ECHOCARDIOGRAM COMPLETE
AR max vel: 2 cm2
AV Area VTI: 2.63 cm2
AV Area mean vel: 2.14 cm2
AV Mean grad: 3 mmHg
AV Peak grad: 5.7 mmHg
Ao pk vel: 1.19 m/s
Area-P 1/2: 2.75 cm2
S' Lateral: 3.1 cm

## 2024-04-20 ENCOUNTER — Other Ambulatory Visit: Payer: Self-pay | Admitting: *Deleted

## 2024-04-20 DIAGNOSIS — I8393 Asymptomatic varicose veins of bilateral lower extremities: Secondary | ICD-10-CM

## 2024-04-23 ENCOUNTER — Telehealth: Payer: Self-pay | Admitting: Family Medicine

## 2024-04-23 NOTE — Telephone Encounter (Unsigned)
 Copied from CRM [id number]. Topic: General - Other >> Apr 23, 2024 10:11 AM Wess RAMAN wrote: Reason for CRM: Patient would like to get the covid vaccine and would like to pick up a paper copy of the prescription  Callback #: [phone]

## 2024-04-24 MED ORDER — COVID-19 MRNA VACC (MODERNA) 50 MCG/0.5ML IM SUSP: 0.5000 mL | Freq: Once | INTRAMUSCULAR | 0 refills | Status: AC

## 2024-04-24 NOTE — Telephone Encounter (Signed)
 Patient is aware the Rx was printed and left at the front desk for pick up.

## 2024-04-25 ENCOUNTER — Telehealth: Payer: Self-pay | Admitting: Cardiovascular Disease

## 2024-04-25 NOTE — Telephone Encounter (Signed)
 Patient reports that she has still been having issues with dizziness. She states that recently Callie decreased her olmesartan . She states that since then she has felt better however she has had concerns about her blood pressure. She reports that it has been all over the place. She does have a log that she has been keeping. She will send a MyChart message with a list of her readings.

## 2024-04-25 NOTE — Telephone Encounter (Signed)
 Pt c/o BP issue: STAT if pt c/o blurred vision, one-sided weakness or slurred speech  1. What are your last 5 BP readings? 173/94 this morning at 9:23 and then 138/101  2. Are you having any other symptoms (ex. Dizziness, headache, blurred vision, passed out)? Dizziness is often   3. What is your BP issue? Pt stated she has concerns about her Bp fluctuating so much. She stated it's been an ongoing thing and they've been working on it. She needs guidance on what to do for now until she's seen again. Please advise

## 2024-04-27 ENCOUNTER — Ambulatory Visit: Admitting: Internal Medicine

## 2024-04-30 ENCOUNTER — Telehealth: Payer: Self-pay

## 2024-04-30 DIAGNOSIS — Z1231 Encounter for screening mammogram for malignant neoplasm of breast: Secondary | ICD-10-CM

## 2024-04-30 DIAGNOSIS — Z1239 Encounter for other screening for malignant neoplasm of breast: Secondary | ICD-10-CM

## 2024-04-30 DIAGNOSIS — M810 Age-related osteoporosis without current pathological fracture: Secondary | ICD-10-CM

## 2024-04-30 NOTE — Telephone Encounter (Signed)
 Copied from CRM [id number]. Topic: Referral - Question >> Apr 30, 2024  1:10 PM Aleatha C wrote: Reason for CRM: Patient would like to get a mammogram and dex scan schedule and would like a referral if she needs one from Dr Ozell

## 2024-05-01 NOTE — Addendum Note (Signed)
 Addended by: CHRISTYNE IDELL LABOR on: 05/01/2024 02:46 PM   Modules accepted: Orders

## 2024-05-01 NOTE — Telephone Encounter (Signed)
 Spoke with the patient and informed her orders were entered as below.  Patient is aware someone will contact her to schedule a mammogram.  Offered to have front staff speak with her to schedule the bone density, she declined at this time and stated she will call back.

## 2024-05-01 NOTE — Telephone Encounter (Signed)
 Ok to order both -- DEXA should go to [facility] and mammo to the breast center. Thanks!

## 2024-05-02 ENCOUNTER — Other Ambulatory Visit: Payer: Self-pay

## 2024-05-02 DIAGNOSIS — M810 Age-related osteoporosis without current pathological fracture: Secondary | ICD-10-CM

## 2024-05-02 MED ORDER — DENOSUMAB 60 MG/ML ~~LOC~~ SOSY: 60.0000 mg | PREFILLED_SYRINGE | SUBCUTANEOUS | Status: AC

## 2024-05-02 NOTE — Progress Notes (Signed)
 Pt on bone density report. Order placed for PA.

## 2024-05-09 NOTE — Progress Notes (Deleted)
 Cardiology Office Note:    Date:  05/09/2024   ID:  Angela Daniels, DOB May 25, 1946, MRN [id number]  PCP:  Ozell Heron HERO, MD  Cardiologist:  Lonni Cash, MD { Click to update primary MD,subspecialty MD or APP then REFRESH:1}    Referring MD: Ozell Heron HERO, MD   Chief Complaint: concerns about BP  History of Present Illness:    Angela Daniels is a 78 y.o. 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 Cash and presents today for follow-up of concerns about BP.   Patient moved from [place]  to [facility] in 02/2023. She was recently referred to Dr. Cash in 10/2023 after a recent ED visit for chest pain. Work-up in the ED was unremarkable. Prior coronary calcium  score in 06/2023 was 8 (32nd percentile for age and sex). At initial visit, she reported chest pressure at rest and with exertion as well as palpitations and dyspnea. Recent cardiac monitor in 08/2023 showed normal sinus rhythm with one 4 beat run of NSVT, a couple of short runs of SVT (longest run 13 beats), and rare PACs/ PVCs but no significant arrhythmias. Amlodipine  was stopped and she was switched to Cardizem  CD instead. Coronary CTA and Echo were ordered for further evaluation. Coronary CTA showed a coronary calcium  score of 12.4 (34th percentile for age and sex), total plaque volume of 80 mm3 (24th percentile for age and sex), and only minimal plaque in the LAD and Diag but no obstructive CAD. 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 Echo showed LVEF of 60-65% with normal wall motion and diastolic parameters, normal RV  function, mild biatrial enlargement, mild MR, moderate TR, and a small pericardial effusion.   She was seen in the ED on 02/28/2024 further evaluation of dizziness and near syncope as well as some bilateral lower upper abdominal/ lower chest pain that wrapped around to her back. EKG showed no acute ischemic changes. High-sensitivity troponin negative x2. Lipase and LFTs were normal. Head CT, chest CTA, and abdominal/ pelvic CT were ordered and showed no acute findings to explain symptoms. Of note, CTA showed no evidence of aortic aneurysm or dissection. Ascending aorta was borderline in size and prior aneurysmal measurements appear to have been from axial slices obtained at obliquity exaggerating the diameter.   Right lower extremity venous doppler on 02/28/2024 showed findings consistent with a chronic superficial vein thrombosis involving the right great saphenous vein and right superficial veins/ varicosities. She was started on Eliquis  by her PCP's office.   She was last seen by me on 7/28/22025 at which time she had multiple concerns but the main one had to due with weakness, dizziness, and feeling off balance. From a cardiac standpoint, she was relatively stable. She continued to report occasional episodes of atypical chest pain but it did not sound cardiac. Dizziness sound vasovagal or orthostatic in nature and conservative measures were discussed.   She was then seen by Neurology on 03/15/2024 for dizziness who reviewed recent imaging and did not find a neurological explanation for her symptoms. She was referred to Psychology for evaluation and management of stress.   She has continued to have symptoms of dizziness and weakness.  She has been keeping a track of her BP at home. Home BP log did show some soft BP and some orthostatics readings so Olmesartan  was decreased and then ultimately stopped completely. She also sent us  Kardia mobile strips during episodes of dizziness which showed sinus rhythm.    She saw Pulmonology in 03/2024 for further evaluation of dyspnea. Repeat Echo and PFTs were ordered as well as extensive lab work. Labs were unremarkable. Echo showed LVEF of 55-60% with grade 1 diastolic dysfunction, mildly enlarged RV with normal RV function, severely dilated left atrium, mild MR, moderate TR, normal PASP, and small pericardial effusion.   Patient presents today for follow-up. ***  Hypertension Orthostatic Hypotension Patient has a history of hypertension. However, recently has struggled with soft BP and some orthostatic readings on home BP log. Valsartan was recently stopped. - *** - Continue Hydralazine  10mg  as needed for BP >160/100.   Dizziness Near Syncope Patient was seen in the ED in 02/2024 for dizziness/ near syncope. Work-up was unremarkable. Recent monitor in 08/2023 showed no significant arrhythmias. Recent Echo in 04/2024 showed normal LV function with no significant valvular disease.  - ***  Minimal Non-Obstructive CAD Coronary CTA in 3/2025showed a coronary calcium  score of 12.4 (34th percentile for age and sex), total plaque volume of 80 mm3 (24th percentile for age and sex), and only minimal plaque in the LAD and Diag but no obstructive CAD.  - No angina. - Can hold off on Aspirin given only minimal CAD. - Continue statin. - Continue to increase physical activity as tolerated. ***   Paroxysmal SVT Patient has a history of palpitations. Recent monitor in 08/2023 showed normal sinus rhythm with one 4 beat run of NSVT, a couple of short runs of SVT (longest run 13 beats), and rare PACs/ PVCs but no significant arrhythmias. - Stable. No significant palpitations. *** - Continue Cardizem  CD 120mg  daily. ***    Mild Mitral Regurgitation Moderate Tricuspid Regurgitation Noted on recent Echo in 04/2024.  - Can continue routine monitoring with serial Echos.    Chronic Superficial Venous Thrombosis Recent right lower extremity venous dopplers on 02/28/2024  showed findings consistent with chronic superficial vein thrombosis involving the right great saphenous vein and right superficial veins/ varicosities but no DVT. She was started on Eliquis  by PCP's office. - Continue Eliquis  per PCP.    Hyperlipidemia Lipid panel in 06/2023: Total Cholesterol 201, Triglycerides 73, HDL 63.2, LDL 123. LDL goal <70 given CAD.  - She was started on Crestor  *** at last visit.  - Plan is to repeat lipid panel and LFTs in about 1 month.    EKGs/Labs/Other Studies Reviewed:    The following studies were reviewed:  Monitor 09/13/2023 to 09/27/2023: NSR with sinus brady (46/min) and sinus tachy (194/min), ave 71/min. One episodes of NSVT, 4 beats at 194/min. NS SVT, 6 beats at 190 and 13 beats at 100/min. Rare PVC's and PAC's (<1%) No atrial fib or sustained VT or SVT. Symptoms associated with NSR _______________   Coronary CTA 11/09/2023: Impressions: 1. Coronary calcium  score of 12.4. This was 34 percentile for age-, sex, and race-matched controls. 2. Total plaque volume 80 mm3 which is 24 percentile for age- and sex-matched controls (calcified plaque 3 mm3; non-calcified plaque 77 mm3). TPV is (mild). 3. Normal coronary origin with right dominance. 4. Minimal plaque in the LAD and diagonal. 5. Mildly dilated aortic root (3.8 cm); mild aortic atherosclerosis. _______________   Echocardiogram 11/10/2023: Impressions:  1. Left ventricular ejection fraction,  by estimation, is 60 to 65%. The  left ventricle has normal function. The left ventricle has no regional  wall motion abnormalities. Left ventricular diastolic parameters were  normal.   2. Right ventricular systolic function is normal. The right ventricular  size is normal. There is normal pulmonary artery systolic pressure.   3. Left atrial size was mildly dilated.   4. Right atrial size was mildly dilated.   5. A small pericardial effusion is present. There is no evidence of  cardiac tamponade.    6. The mitral valve is normal in structure. Mild mitral valve  regurgitation. No evidence of mitral stenosis.   7. Tricuspid valve regurgitation is moderate.   8. The aortic valve is tricuspid. Aortic valve regurgitation is trivial.  No aortic stenosis is present.   9. The inferior vena cava is normal in size with greater than 50%  respiratory variability, suggesting right atrial pressure of 3 mmHg.   Comparison(s): No prior Echocardiogram.   Conclusion(s)/Recommendation(s): Otherwise normal echocardiogram, with  minor abnormalities described in the report.  _______________   Chest CTA 02/22/2024: Summary: - No evidence of pulmonary emboli with borderline prominent pulmonary trunk. - Stable cardiomegaly with small pericardial effusion. - Aortic and coronary artery atherosclerosis. No aortic aneurysm or dissection. Borderline size ascending aorta 3.8 x 3.8 cm. Prior aneurysmal measurements appear to have been from axial slices obtained at obliquity exaggerating the diameter. - Chronic scarring, bronchiectasis and bronchial impactions in the anteromedial base of the lingula and right middle lobe consistent with a chronic inflammatory/infectious process such as MAC. _______________  Echocardiogram 04/18/2024: Impressions: 1. Left ventricular ejection fraction, by estimation, is 55 to 60%. The  left ventricle has normal function. The left ventricle has no regional  wall motion abnormalities. Left ventricular diastolic parameters are  consistent with Grade I diastolic  dysfunction (impaired relaxation).   2. Right ventricular systolic function is normal. The right ventricular  size is mildly enlarged. There is normal pulmonary artery systolic  pressure. The estimated right ventricular systolic pressure is 26.0 mmHg.   3. Left atrial size was severely dilated.   4. A small pericardial effusion is present. The pericardial effusion is  circumferential. There is no evidence of cardiac  tamponade.   5. The mitral valve is grossly normal. Mild mitral valve regurgitation.   6. Tricuspid valve regurgitation is moderate.   7. The aortic valve is tricuspid. Aortic valve regurgitation is trivial.   8. There is borderline dilatation of the ascending aorta, measuring 36  mm.   9. The inferior vena cava is normal in size with greater than 50%  respiratory variability, suggesting right atrial pressure of 3 mmHg.   Comparison(s): No significant change from prior study. Prior images  reviewed side by side.   EKG:  EKG not ordered today.  Recent Labs: 02/22/2024: ALT 20; BUN 9; Creatinine, Ser 0.69; Magnesium 2.1; Potassium 4.0; Sodium 136; TSH 1.83 04/05/2024: Hemoglobin 14.3; Platelets 248.0  Recent Lipid Panel    Component Value Date/Time   CHOL 201 (H) 06/23/2023 1131   TRIG 73.0 06/23/2023 1131   HDL 63.20 06/23/2023 1131   CHOLHDL 3 06/23/2023 1131   VLDL 14.6 06/23/2023 1131   LDLCALC 123 (H) 06/23/2023 1131    Physical Exam:    Vital Signs: There were no vitals taken for this visit.    Wt Readings from Last 3 Encounters:  04/05/24 174 lb (78.9 kg)  03/26/24 173 lb 11.2 oz (78.8 kg)  03/15/24 171 lb 8 oz (  77.8 kg)     General: 78 y.o. female in no acute distress. HEENT: Normocephalic and atraumatic. Sclera clear.  Neck: Supple. No carotid bruits. No JVD. Heart: *** RRR. Distinct S1 and S2. No murmurs, gallops, or rubs.  Lungs: No increased work of breathing. Clear to ausculation bilaterally. No wheezes, rhonchi, or rales.  Abdomen: Soft, non-distended, and non-tender to palpation.  Extremities: No lower extremity edema.  Radial and distal pedal pulses 2+ and equal bilaterally. Skin: Warm and dry. Neuro: No focal deficits. Psych: Normal affect. Responds appropriately.   Assessment:    No diagnosis found.  Plan:     Disposition: Follow up in ***   Signed, Aline FORBES Door, PA-C  05/09/2024 11:21 AM    [facility]

## 2024-05-10 ENCOUNTER — Telehealth: Payer: Self-pay | Admitting: Cardiovascular Disease

## 2024-05-10 ENCOUNTER — Ambulatory Visit: Payer: Self-pay

## 2024-05-10 ENCOUNTER — Telehealth: Payer: Self-pay | Admitting: Family Medicine

## 2024-05-10 ENCOUNTER — Ambulatory Visit (HOSPITAL_COMMUNITY)

## 2024-05-10 ENCOUNTER — Encounter: Payer: Self-pay | Admitting: Student

## 2024-05-10 NOTE — Telephone Encounter (Signed)
 Patient informed of the message below and voiced understanding

## 2024-05-10 NOTE — Telephone Encounter (Signed)
 I spoke with patient and gave her message from pharmacist.  I asked her to reach out to her PCP to see if they were OK with her taking half tablet Eliquis  twice a day while on Paxlovid.

## 2024-05-10 NOTE — Telephone Encounter (Signed)
 I spoke with patient and told her she cannot take Paxlovid with Eliquis  and that it interacts with Rosuvastatin . Patient reports she stopped Rosuvastatin  awhile ago. Patient reports she was started on Eliquis  by her PCP for a superficial blood clot and that she is almost at the end of her treatment and would like to stop Eliquis  early.  I told patient she should check with her PCP before stopping Eliquis  early   Patient then reported when she had video visit with [facility] for covid she was told it would be OK to take one Eliquis  per day and make sure Paxlovid was spaced out 2 hours from dose of Eliquis .  I told patient our pharmacist said she could not take Paxlovid with Eliquis . Patient said she did not take Eliquis  last night and this morning and took a dose of Paxlovid today.  I advised her to discuss stopping Eliquis  with her PCP. Patient would like to know how long she needs to be off Eliquis  before she can take Paxlovid?  Patient reports she is not taking Losartan.  Her Olmesartan  was recently stopped per 9/14 my chart message. Patient reports last night BP was 150/90 so she took Olmesartan  10 mg.  She is asking if OK to take Olmesartan  daily as needed.    Today her BP is 130/85 and 134/75. Heart rate if 72-73 She has not taken any prn hydralazine    Patient is asking if OK to take tylenol or ibuprofen.  I told her it was OK to take tylenol.  She would also like to know if OK to take Coricidin HP and if she could use a nasal spray.

## 2024-05-10 NOTE — Telephone Encounter (Signed)
 While she is taking paxlovid, she should reduce her eliquis  to HALF dose-- so if she is on 5 mg BID she should reduce to 2.5 mg (1/2 tablet) BID. This can be done until she is finished with her paxlovid, then she can go back to her usual dose.

## 2024-05-10 NOTE — Telephone Encounter (Signed)
 Cannot take Paxlovid with Eliquis . There is also interaction with rosuvastatin . Is she taking losartan along with Olmesartan ?

## 2024-05-10 NOTE — Telephone Encounter (Signed)
  Patient has tested positive for covid and has been prescribed Paxlovid for her to take. She would like to know if it is okay for her to take that with her Eliquis  and other meds. Please advise.  Her BP is going up while she is sick and she did take a Losartan last night and it did go down. Callie Goodrich had taken her off of the med so she would like to make sure it is okay if she takes one if her BP goes up

## 2024-05-10 NOTE — Telephone Encounter (Signed)
 Copied from CRM #[id number]. Topic: Clinical - Pink Word Triage >> May 10, 2024  3:18 PM Jasmin G wrote: Reason for Triage: Documented in CRM. >> May 10, 2024  3:22 PM Jasmin G wrote: Pt states that she recently got diagnosed with covid and was prescribed paxlovid, she is currently taking Eliquis  as well and found out that meds could counteract with each other and requested a phone call back at [phone] to discuss. Pt also wanted to know it would be okay for her to stop taking Eliquis  a week before she's supposed to stop taking med completely as she was told to do this by her cardiologist.

## 2024-05-10 NOTE — Telephone Encounter (Signed)
 Message from [place] G sent at 05/10/2024  3:22 PM EDT  Summary: Meds prescriptions   Pt states that she recently got diagnosed with covid and was prescribed paxlovid, she is currently taking Eliquis  as well and found out that meds could counteract with each other and requested a phone call back at [phone] to discuss. Pt also wanted to know it would be okay for her to stop taking Eliquis  a week before she's supposed to stop taking med completely as she was told to do this by her cardiologist.

## 2024-05-10 NOTE — Telephone Encounter (Signed)
 Yes, ok to take olmesartan , nasal spray, and coricidin HP Technically she can cut her Eliquis  is half and take 1/2 tablet twice a day while on Paxlovid

## 2024-05-11 ENCOUNTER — Encounter

## 2024-05-11 NOTE — Telephone Encounter (Signed)
 Please see encounter from 05/10/2024

## 2024-05-12 NOTE — Progress Notes (Unsigned)
 Cardiology Office Note:    Date:  05/12/2024   ID:  Angela Daniels, DOB [birth year], MRN [id number]  PCP:  Ozell Heron HERO, MD  Cardiologist:  Lonni Cash, MD { Click to update primary MD,subspecialty MD or APP then REFRESH:1}    Referring MD: Ozell Heron HERO, MD   Chief Complaint: concerns about BP  History of Present Illness:    Angela Daniels is a 78 y.o. 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 Cash and presents today for follow-up of concerns about BP.   Patient moved from [place]  to [facility] in 02/2023. She was recently referred to Dr. Cash in 10/2023 after a recent ED visit for chest pain. Work-up in the ED was unremarkable. Prior coronary calcium  score in 06/2023 was 8 (32nd percentile for age and sex). At initial visit, she reported chest pressure at rest and with exertion as well as palpitations and dyspnea. Recent cardiac monitor in 08/2023 showed normal sinus rhythm with one 4 beat run of NSVT, a couple of short runs of SVT (longest run 13 beats), and rare PACs/ PVCs but no significant arrhythmias. Amlodipine  was stopped and she was switched to Cardizem  CD instead. Coronary CTA and Echo were ordered for further evaluation. Coronary CTA showed a coronary calcium  score of 12.4 (34th percentile for age and sex), total plaque volume of 80 mm3 (24th percentile for age and sex), and only minimal plaque in the LAD and Diag but no obstructive CAD. 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 Echo showed LVEF of 60-65% with normal wall motion and diastolic parameters, normal RV  function, mild biatrial enlargement, mild MR, moderate TR, and a small pericardial effusion.   She was seen in the ED on 02/28/2024 further evaluation of dizziness and near syncope as well as some bilateral lower upper abdominal/ lower chest pain that wrapped around to her back. EKG showed no acute ischemic changes. High-sensitivity troponin negative x2. Lipase and LFTs were normal. Head CT, chest CTA, and abdominal/ pelvic CT were ordered and showed no acute findings to explain symptoms. Of note, CTA showed no evidence of aortic aneurysm or dissection. Ascending aorta was borderline in size and prior aneurysmal measurements appear to have been from axial slices obtained at obliquity exaggerating the diameter.   Right lower extremity venous doppler on 02/28/2024 showed findings consistent with a chronic superficial vein thrombosis involving the right great saphenous vein and right superficial veins/ varicosities. She was started on Eliquis  by her PCP's office.   She was last seen by me on 7/28/22025 at which time she had multiple concerns but the main one had to due with weakness, dizziness, and feeling off balance. From a cardiac standpoint, she was relatively stable. She continued to report occasional episodes of atypical chest pain but it did not sound cardiac. Dizziness sound vasovagal or orthostatic in nature and conservative measures were discussed.   She was then seen by Neurology on 03/15/2024 for dizziness who reviewed recent imaging and did not find a neurological explanation for her symptoms. She was referred to Psychology for evaluation and management of stress.   She has continued to have symptoms of dizziness and weakness.  She has been keeping a track of her BP at home. Home BP log did show some soft BP and some orthostatics readings so Olmesartan  was decreased and then ultimately stopped completely. She also sent us  Kardia mobile strips during episodes of dizziness which showed sinus rhythm.    She saw Pulmonology in 03/2024 for further evaluation of dyspnea. Repeat Echo and PFTs were ordered as well as extensive lab work. Labs were unremarkable. Echo showed LVEF of 55-60% with grade 1 diastolic dysfunction, mildly enlarged RV with normal RV function, severely dilated left atrium, mild MR, moderate TR, normal PASP, and small pericardial effusion.   Patient presents today for follow-up. ***  Hypertension Orthostatic Hypotension Patient has a history of hypertension. However, recently has struggled with soft BP and some orthostatic readings on home BP log. Valsartan was recently stopped. - *** - Continue Hydralazine  10mg  as needed for BP >160/100.   Dizziness Near Syncope Patient was seen in the ED in 02/2024 for dizziness/ near syncope. Work-up was unremarkable. Recent monitor in 08/2023 showed no significant arrhythmias. Recent Echo in 04/2024 showed normal LV function with no significant valvular disease.  - ***  Minimal Non-Obstructive CAD Coronary CTA in 3/2025showed a coronary calcium  score of 12.4 (34th percentile for age and sex), total plaque volume of 80 mm3 (24th percentile for age and sex), and only minimal plaque in the LAD and Diag but no obstructive CAD.  - No angina. - Can hold off on Aspirin given only minimal CAD. - Continue statin. - Continue to increase physical activity as tolerated. ***   Paroxysmal SVT Patient has a history of palpitations. Recent monitor in 08/2023 showed normal sinus rhythm with one 4 beat run of NSVT, a couple of short runs of SVT (longest run 13 beats), and rare PACs/ PVCs but no significant arrhythmias. - Stable. No significant palpitations. *** - Continue Cardizem  CD 120mg  daily. ***    Mild Mitral Regurgitation Moderate Tricuspid Regurgitation Noted on recent Echo in 04/2024.  - Can continue routine monitoring with serial Echos.    Chronic Superficial Venous Thrombosis Recent right lower extremity venous dopplers on 02/28/2024  showed findings consistent with chronic superficial vein thrombosis involving the right great saphenous vein and right superficial veins/ varicosities but no DVT. She was started on Eliquis  by PCP's office. - Continue Eliquis  per PCP.    Hyperlipidemia Lipid panel in 06/2023: Total Cholesterol 201, Triglycerides 73, HDL 63.2, LDL 123. LDL goal <70 given CAD.  - She was started on Crestor  *** at last visit.  - Plan is to repeat lipid panel and LFTs in about 1 month.    EKGs/Labs/Other Studies Reviewed:    The following studies were reviewed:  Monitor 09/13/2023 to 09/27/2023: NSR with sinus brady (46/min) and sinus tachy (194/min), ave 71/min. One episodes of NSVT, 4 beats at 194/min. NS SVT, 6 beats at 190 and 13 beats at 100/min. Rare PVC's and PAC's (<1%) No atrial fib or sustained VT or SVT. Symptoms associated with NSR _______________   Coronary CTA 11/09/2023: Impressions: 1. Coronary calcium  score of 12.4. This was 34 percentile for age-, sex, and race-matched controls. 2. Total plaque volume 80 mm3 which is 24 percentile for age- and sex-matched controls (calcified plaque 3 mm3; non-calcified plaque 77 mm3). TPV is (mild). 3. Normal coronary origin with right dominance. 4. Minimal plaque in the LAD and diagonal. 5. Mildly dilated aortic root (3.8 cm); mild aortic atherosclerosis. _______________   Echocardiogram 11/10/2023: Impressions:  1. Left ventricular ejection fraction,  by estimation, is 60 to 65%. The  left ventricle has normal function. The left ventricle has no regional  wall motion abnormalities. Left ventricular diastolic parameters were  normal.   2. Right ventricular systolic function is normal. The right ventricular  size is normal. There is normal pulmonary artery systolic pressure.   3. Left atrial size was mildly dilated.   4. Right atrial size was mildly dilated.   5. A small pericardial effusion is present. There is no evidence of  cardiac tamponade.    6. The mitral valve is normal in structure. Mild mitral valve  regurgitation. No evidence of mitral stenosis.   7. Tricuspid valve regurgitation is moderate.   8. The aortic valve is tricuspid. Aortic valve regurgitation is trivial.  No aortic stenosis is present.   9. The inferior vena cava is normal in size with greater than 50%  respiratory variability, suggesting right atrial pressure of 3 mmHg.   Comparison(s): No prior Echocardiogram.   Conclusion(s)/Recommendation(s): Otherwise normal echocardiogram, with  minor abnormalities described in the report.  _______________   Chest CTA 02/22/2024: Summary: - No evidence of pulmonary emboli with borderline prominent pulmonary trunk. - Stable cardiomegaly with small pericardial effusion. - Aortic and coronary artery atherosclerosis. No aortic aneurysm or dissection. Borderline size ascending aorta 3.8 x 3.8 cm. Prior aneurysmal measurements appear to have been from axial slices obtained at obliquity exaggerating the diameter. - Chronic scarring, bronchiectasis and bronchial impactions in the anteromedial base of the lingula and right middle lobe consistent with a chronic inflammatory/infectious process such as MAC. _______________  Echocardiogram 04/18/2024: Impressions: 1. Left ventricular ejection fraction, by estimation, is 55 to 60%. The  left ventricle has normal function. The left ventricle has no regional  wall motion abnormalities. Left ventricular diastolic parameters are  consistent with Grade I diastolic  dysfunction (impaired relaxation).   2. Right ventricular systolic function is normal. The right ventricular  size is mildly enlarged. There is normal pulmonary artery systolic  pressure. The estimated right ventricular systolic pressure is 26.0 mmHg.   3. Left atrial size was severely dilated.   4. A small pericardial effusion is present. The pericardial effusion is  circumferential. There is no evidence of cardiac  tamponade.   5. The mitral valve is grossly normal. Mild mitral valve regurgitation.   6. Tricuspid valve regurgitation is moderate.   7. The aortic valve is tricuspid. Aortic valve regurgitation is trivial.   8. There is borderline dilatation of the ascending aorta, measuring 36  mm.   9. The inferior vena cava is normal in size with greater than 50%  respiratory variability, suggesting right atrial pressure of 3 mmHg.   Comparison(s): No significant change from prior study. Prior images  reviewed side by side.   EKG:  EKG not ordered today.  Recent Labs: 02/22/2024: ALT 20; BUN 9; Creatinine, Ser 0.69; Magnesium 2.1; Potassium 4.0; Sodium 136; TSH 1.83 04/05/2024: Hemoglobin 14.3; Platelets 248.0  Recent Lipid Panel    Component Value Date/Time   CHOL 201 (H) 06/23/2023 1131   TRIG 73.0 06/23/2023 1131   HDL 63.20 06/23/2023 1131   CHOLHDL 3 06/23/2023 1131   VLDL 14.6 06/23/2023 1131   LDLCALC 123 (H) 06/23/2023 1131    Physical Exam:    Vital Signs: There were no vitals taken for this visit.    Wt Readings from Last 3 Encounters:  04/05/24 174 lb (78.9 kg)  03/26/24 173 lb 11.2 oz (78.8 kg)  03/15/24 171 lb 8 oz (  77.8 kg)     General: 79 y.o. female in no acute distress. HEENT: Normocephalic and atraumatic. Sclera clear.  Neck: Supple. No carotid bruits. No JVD. Heart: *** RRR. Distinct S1 and S2. No murmurs, gallops, or rubs.  Lungs: No increased work of breathing. Clear to ausculation bilaterally. No wheezes, rhonchi, or rales.  Abdomen: Soft, non-distended, and non-tender to palpation.  Extremities: No lower extremity edema.  Radial and distal pedal pulses 2+ and equal bilaterally. Skin: Warm and dry. Neuro: No focal deficits. Psych: Normal affect. Responds appropriately.   Assessment:    No diagnosis found.  Plan:     Disposition: Follow up in ***   Signed, Aline FORBES Door, PA-C  05/12/2024 9:44 AM    [facility]

## 2024-05-14 ENCOUNTER — Telehealth: Payer: Self-pay | Admitting: *Deleted

## 2024-05-14 NOTE — Telephone Encounter (Signed)
 Message sent to scheduling team to reschedule PFT/OV after 10/10. NFN

## 2024-05-16 ENCOUNTER — Encounter (HOSPITAL_BASED_OUTPATIENT_CLINIC_OR_DEPARTMENT_OTHER)

## 2024-05-18 ENCOUNTER — Encounter: Payer: Self-pay | Admitting: Student

## 2024-05-18 ENCOUNTER — Ambulatory Visit: Admitting: Student

## 2024-05-18 ENCOUNTER — Ambulatory Visit (INDEPENDENT_AMBULATORY_CARE_PROVIDER_SITE_OTHER): Admitting: Student

## 2024-05-18 ENCOUNTER — Ambulatory Visit
Admission: RE | Admit: 2024-05-18 | Discharge: 2024-05-18 | Disposition: A | Discharge status: Home / Self Care | Source: Ambulatory Visit | Attending: Vascular Surgery | Admitting: Vascular Surgery

## 2024-05-18 VITALS — BP 146/86 | HR 76 | Ht 70.0 in | Wt 171.0 lb

## 2024-05-18 DIAGNOSIS — E785 Hyperlipidemia, unspecified: Secondary | ICD-10-CM | POA: Diagnosis present

## 2024-05-18 DIAGNOSIS — I82819 Embolism and thrombosis of superficial veins of unspecified lower extremities: Secondary | ICD-10-CM | POA: Insufficient documentation

## 2024-05-18 DIAGNOSIS — I8393 Asymptomatic varicose veins of bilateral lower extremities: Secondary | ICD-10-CM | POA: Insufficient documentation

## 2024-05-18 DIAGNOSIS — I2510 Atherosclerotic heart disease of native coronary artery without angina pectoris: Secondary | ICD-10-CM | POA: Insufficient documentation

## 2024-05-18 DIAGNOSIS — R42 Dizziness and giddiness: Secondary | ICD-10-CM | POA: Diagnosis not present

## 2024-05-18 DIAGNOSIS — I071 Rheumatic tricuspid insufficiency: Secondary | ICD-10-CM

## 2024-05-18 DIAGNOSIS — I340 Nonrheumatic mitral (valve) insufficiency: Secondary | ICD-10-CM | POA: Insufficient documentation

## 2024-05-18 DIAGNOSIS — I471 Supraventricular tachycardia, unspecified: Secondary | ICD-10-CM | POA: Insufficient documentation

## 2024-05-18 DIAGNOSIS — I10 Essential (primary) hypertension: Secondary | ICD-10-CM | POA: Insufficient documentation

## 2024-05-18 DIAGNOSIS — R55 Syncope and collapse: Secondary | ICD-10-CM | POA: Insufficient documentation

## 2024-05-18 MED ORDER — OLMESARTAN MEDOXOMIL 5 MG PO TABS: 5.0000 mg | ORAL_TABLET | Freq: Every day | ORAL | 0 refills | Status: DC

## 2024-05-18 MED ORDER — ROSUVASTATIN CALCIUM 10 MG PO TABS: 10.0000 mg | ORAL_TABLET | Freq: Every day | ORAL | Status: DC

## 2024-05-18 MED ORDER — OLMESARTAN MEDOXOMIL 5 MG PO TABS: 5.0000 mg | ORAL_TABLET | Freq: Every day | ORAL | Status: DC

## 2024-05-18 NOTE — Patient Instructions (Signed)
 Thank you for choosing [facility]!     Medication Instructions:  Start Olmesartan  5mg . Take this tablet once daily.   *If you need a refill on your cardiac medications before your next appointment, please call your pharmacy*   Lab Work: Return for fasting labs before your next appointment.   If you have labs (blood work) drawn today and your tests are completely normal, you will receive your results only by: MyChart Message (if you have MyChart) OR A paper copy in the mail If you have any lab test that is abnormal or we need to change your treatment, we will call you to review the results.   Testing/Procedures: No medication changes were made during today's visit.   Your next appointment:   3 week(s)   Provider:   Aline Door, PA-C           Follow-Up: At [facility], you and your health needs are our priority.  As part of our continuing mission to provide you with exceptional heart care, we have created designated Provider Care Teams.  These Care Teams include your primary Cardiologist (physician) and Advanced Practice Providers (APPs -  Physician Assistants and Nurse Practitioners) who all work together to provide you with the care you need, when you need it. We recommend signing up for the patient portal called MyChart.  Sign up information is provided on this After Visit Summary.  MyChart is used to connect with patients for Virtual Visits (Telemedicine).  Patients are able to view lab/test results, encounter notes, upcoming appointments, etc.  Non-urgent messages can be sent to your provider as well.   To learn more about what you can do with MyChart, go to [email or web address].

## 2024-05-21 ENCOUNTER — Ambulatory Visit: Admitting: Primary Care

## 2024-05-22 ENCOUNTER — Ambulatory Visit: Admitting: Physician Assistant

## 2024-05-23 ENCOUNTER — Telehealth: Payer: Self-pay | Admitting: *Deleted

## 2024-05-23 NOTE — Telephone Encounter (Signed)
 Copied from CRM [id number]. Topic: General - Other >> May 22, 2024 10:59 AM Ahlexyia S wrote: Reason for CRM: Pt is wanting to have the Prolia  shot done at her visit on Nov 24th @1pm . Informed pt that I will send message to clinic to confirm. Clinic will reach out to pt if need be. >> May 22, 2024 11:09 AM Nurse Camelia F wrote: I have not received the Prior Auth for the Prolia  at this time. I will call her when this is received.

## 2024-05-23 NOTE — Telephone Encounter (Signed)
 Left a message for the patient to return my call.

## 2024-05-24 ENCOUNTER — Telehealth: Payer: Self-pay | Admitting: *Deleted

## 2024-05-24 NOTE — Telephone Encounter (Signed)
 Copied from CRM [id number]. Topic: General - Other >> May 23, 2024  5:00 PM Joesph B wrote: Reason for CRM: patient calling to speak to [employer].

## 2024-05-24 NOTE — Telephone Encounter (Signed)
 Spoke with the patient and informed her per Bari, clinical supervisor we are awaiting prior approval for the Prolia  and we will contact her once further information is received.

## 2024-05-25 ENCOUNTER — Encounter

## 2024-05-27 NOTE — Progress Notes (Signed)
 Echo is stable compared to prior - mild heart muschel stiffness, milld leaky mitral valve and moderate Triscupid valve leak , severe dilation of upper chamber of atrium and small fluids around heart but pump function is nromal.  No change

## 2024-05-28 ENCOUNTER — Encounter (HOSPITAL_BASED_OUTPATIENT_CLINIC_OR_DEPARTMENT_OTHER): Payer: Self-pay

## 2024-05-28 ENCOUNTER — Ambulatory Visit (HOSPITAL_BASED_OUTPATIENT_CLINIC_OR_DEPARTMENT_OTHER)

## 2024-05-28 ENCOUNTER — Ambulatory Visit (INDEPENDENT_AMBULATORY_CARE_PROVIDER_SITE_OTHER)

## 2024-05-28 ENCOUNTER — Ambulatory Visit: Admitting: Primary Care

## 2024-05-28 VITALS — BP 122/74 | HR 74 | Ht 70.0 in | Wt 171.0 lb

## 2024-05-28 DIAGNOSIS — J479 Bronchiectasis, uncomplicated: Secondary | ICD-10-CM

## 2024-05-28 DIAGNOSIS — R002 Palpitations: Secondary | ICD-10-CM

## 2024-05-28 DIAGNOSIS — R42 Dizziness and giddiness: Secondary | ICD-10-CM | POA: Diagnosis not present

## 2024-05-28 DIAGNOSIS — Z872 Personal history of diseases of the skin and subcutaneous tissue: Secondary | ICD-10-CM

## 2024-05-28 DIAGNOSIS — R0609 Other forms of dyspnea: Secondary | ICD-10-CM

## 2024-05-28 DIAGNOSIS — Z831 Family history of other infectious and parasitic diseases: Secondary | ICD-10-CM

## 2024-05-28 DIAGNOSIS — R55 Syncope and collapse: Secondary | ICD-10-CM

## 2024-05-28 LAB — PULMONARY FUNCTION TEST
DL/VA % pred: 115 %
DL/VA: 4.53 ml/min/mmHg/L
DLCO cor % pred: 91 %
DLCO cor: 21.17 ml/min/mmHg
DLCO unc % pred: 93 %
DLCO unc: 21.73 ml/min/mmHg
FEF 25-75 Post: 2.31 L/s
FEF 25-75 Pre: 1.72 L/s
FEF2575-%Change-Post: 34 %
FEF2575-%Pred-Post: 116 %
FEF2575-%Pred-Pre: 87 %
FEV1-%Change-Post: 7 %
FEV1-%Pred-Post: 97 %
FEV1-%Pred-Pre: 90 %
FEV1-Post: 2.6 L
FEV1-Pre: 2.43 L
FEV1FVC-%Change-Post: 3 %
FEV1FVC-%Pred-Pre: 98 %
FEV6-%Change-Post: 3 %
FEV6-%Pred-Post: 100 %
FEV6-%Pred-Pre: 97 %
FEV6-Post: 3.4 L
FEV6-Pre: 3.28 L
FEV6FVC-%Change-Post: 0 %
FEV6FVC-%Pred-Post: 103 %
FEV6FVC-%Pred-Pre: 103 %
FVC-%Change-Post: 3 %
FVC-%Pred-Post: 96 %
FVC-%Pred-Pre: 93 %
FVC-Post: 3.43 L
FVC-Pre: 3.31 L
Post FEV1/FVC ratio: 76 %
Post FEV6/FVC ratio: 99 %
Pre FEV1/FVC ratio: 73 %
Pre FEV6/FVC Ratio: 99 %
RV % pred: 98 %
RV: 2.57 L
TLC % pred: 96 %
TLC: 5.76 L

## 2024-05-28 MED ORDER — ALBUTEROL SULFATE HFA 108 (90 BASE) MCG/ACT IN AERS: 2.0000 | INHALATION_SPRAY | Freq: Four times a day (QID) | RESPIRATORY_TRACT | 6 refills | Status: AC | PRN

## 2024-05-28 NOTE — Progress Notes (Signed)
 Full PFT performed today.

## 2024-05-28 NOTE — Patient Instructions (Signed)
 Use Albuterol  2 puffs inhaled every 4-6 hours as needed for shortness of breath.  Follow up in 3 months.

## 2024-05-28 NOTE — Progress Notes (Signed)
 @Patient  ID: Angela Daniels, female    DOB: 04/30/1946, 78 y.o.   MRN: [id number]  Chief Complaint  Patient presents with   Follow-up    Referring provider: Geronimo Amel, MD  HPI: Angela Daniels is a 78 y/o female with PMH of HTN and bronchiectasis who presents today for follow up.  She was seen in our office on 04/05/2024 for initial consultation after an incidental finding of bilateral mid zone bronchiectasis on chest CT.  Her main complaints have been fatigue, dyspnea on exertion, lower chest tightness, and difficulty getting her ADLs done associated with dizziness and lightheadedness.  She has been seen by neurology, cardiology, psychiatry, rheumatology, and pulmonology for evaluation of this.  Thus far her workup has been negative with the exception of a positive ANA, echo with dilated right atrium, and ongoing hypertension alternating with hypotension.  Notably, she did end up with a superficial blood clot in her lower extremity at the end of July 2025 and is now on Eliquis .  She reports that her initial complaint of dyspnea on exertion has resolved.  She is now back to walking on trails and is not having shortness of breath.  She continues to struggle with fatigue and dizziness.  She is following actively with cardiology for this.  She did have a recent increase in her blood pressure medication that she notes. Today she completed her pulmonary function testing.  We reviewed these today.  FVC of 93%, FEV1 90%, FEV1 to FVC ratio of 98%.  Total lung capacity 96%.  Overall spirometry and lung volumes are noted to be normal.  DLCO also normal.  Full read pending. She denies fever, chills, cough, chest pain, weight changes, vision changes, heat or cold intolerance, or night sweats.    04/05/2024: HPI Angela Daniels 78 y.o. -referred by primary care physician for evaluation of bronchiectasis bilateral mid zone incidental findings.  78 year old lady who is originally from [place]  but then  retired to [place]  with her husband who worked in [employer].  He ultimately passed away in Jul 08, 2024after suffering from an aggressive form of prostate cancer.  She says up until 2 summers ago she was actively playing tennis but after her husband's illness the tennis went down but she was still active and kept herself physically fit.  After her husband passed away she relocated to [facility] [place]  in September 2024 to be with her son and grandson.  Here she is continue to be active and fit but more recently has gotten even more active getting back into tennis [she plays at the [employer] level 3.5].  She is also been doing gardening.  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  But otherwise quite healthy.  She was originally born in [place] the country.  That she got exposed to her sister who had tuberculosis.  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   Review of the records indicate that in March 2025 she did have echocardiogram she also reported ZIO monitoring all of this was normal. Against this backdrop, on February 14, 2024 she played tennis and felt great.  The next day also did some gardening and then abruptly on February 16, 2024 she had a near syncopal episode and nearly fell but actually did not fall.  Her blood pressure was okay but since then she started having progressive symptoms of  feeling poorly.  She was then referred to the ER where she had CT scan of the chest head all of which was normal except for bilateral mid zone bronchiectasis [personally visualized and confirmed] but she has never had cough she still does not have cough.  CT angiogram chest was negative in the ED for pulm embolism.  However she is continue to have new onset shortness of breath since February 16, 2024 along with dizziness.  She is also reporting rib pressure.  The symptoms are progressive.   Dyspnea is class III.  She is unable to play tennis.  She is also having balance issues.   Sit/stand test here is normal except she got tachycardic.   TEST/EVENTS : COVID positive on 05/09/2024  Allergies  Allergen Reactions   Shellfish Allergy  Shortness Of Breath   Iodine     Severe burning sensation   Latex     Cannot tolerate for long periods of time.   Sulfa Antibiotics     Immunization History  Administered Date(s) Administered   Fluad Quad(high Dose 65+) 06/04/2021   Fluzone Influenza virus vaccine,trivalent (IIV3), split virus 06/12/2014   INFLUENZA, HIGH DOSE SEASONAL PF 06/27/2015, 05/24/2016, 06/15/2017, 05/06/2018, 04/17/2019   Influenza,inj,Quad PF,6+ Mos 04/27/2019   Influenza,inj,quad, With Preservative 05/06/2018   Influenza-Unspecified 05/06/2018, 04/17/2019, 05/23/2020, 05/30/2023   Moderna Sars-Covid-2 Vaccination 09/07/2019, 09/08/2019, 10/01/2019, 10/06/2019, 10/29/2019   Pneumococcal Conjugate-13 04/23/2014   Pneumococcal Polysaccharide-23 08/27/2015   Tdap 08/16/2013, 06/12/2014   Zoster Recombinant(Shingrix) 10/29/2021   Zoster, Live 06/26/2014    Past Medical History:  Diagnosis Date   Allergy     Arthritis    Hypertension    07/20/23   Osteoporosis    Positive TB test    Thoracic aortic aneurysm     Tobacco History: Social History   Tobacco Use  Smoking Status Never  Smokeless Tobacco Never   Counseling given: Not Answered   Outpatient Medications Prior to Visit  Medication Sig Dispense Refill   apixaban  (ELIQUIS ) 5 MG TABS tablet Take 1 tablet (5 mg total) by mouth 2 (two) times daily. 60 tablet 1   cholecalciferol (VITAMIN D3) 25 MCG (1000 UNIT) tablet Take by mouth daily.     denosumab  (PROLIA ) 60 MG/ML SOSY injection Inject 60 mg into the skin every 6 (six) months.     hydrALAZINE  (APRESOLINE ) 10 MG tablet Take 1 tablet (10 mg total) by mouth every 8 (eight) hours as needed (light headedness, dizziness with a bp above 160/100).      olmesartan  (BENICAR ) 5 MG tablet Take 1 tablet (5 mg total) by mouth daily. 30 tablet 0   rosuvastatin  (CRESTOR ) 10 MG tablet Take 1 tablet (10 mg total) by mouth daily.     diphenhydrAMINE  (BENADRYL ) 50 MG capsule Take one capsule 1 hour prior to scan. (Patient not taking: Reported on 05/28/2024) 1 capsule 0   Facility-Administered Medications Prior to Visit  Medication Dose Route Frequency Provider Last Rate Last Admin   denosumab  (PROLIA ) injection 60 mg  60 mg Subcutaneous Once Ozell Heron HERO, MD       [START ON 07/04/2024] denosumab  (PROLIA ) injection 60 mg  60 mg Subcutaneous Q6 months Ozell Heron HERO, MD         Review of Systems: positive as mentioned in HPI  Constitutional:   No  weight loss, night sweats,  Fevers, chills, fatigue, or  lassitude.  HEENT:   No headaches,  Difficulty swallowing,  Tooth/dental problems, or  Sore throat,  No sneezing, itching, ear ache, nasal congestion, post nasal drip,   CV:  No chest pain,  Orthopnea, PND, swelling in lower extremities, anasarca, dizziness, palpitations, syncope.   GI  No heartburn, indigestion, abdominal pain, nausea, vomiting, diarrhea, change in bowel habits, loss of appetite, bloody stools.   Resp: No shortness of breath with exertion or at rest.  No excess mucus, no productive cough,  No non-productive cough,  No coughing up of blood.  No change in color of mucus.  No wheezing.  No chest wall deformity  Skin: no rash or lesions.  GU: no dysuria, change in color of urine, no urgency or frequency.  No flank pain, no hematuria   MS:  No joint pain or swelling.  No decreased range of motion.  No back pain.    Physical Exam  BP 122/74   Pulse 74   Ht 5' 10 (1.778 m)   Wt 171 lb (77.6 kg)   SpO2 99%   BMI 24.54 kg/m   GEN: A/Ox3; pleasant , NAD, well nourished    HEENT:  /AT,  EACs-clear, TMs-wnl, NOSE-clear, THROAT-clear, no lesions, no postnasal drip or exudate noted.   NECK:  Supple w/  fair ROM; no JVD; normal carotid impulses w/o bruits; no thyromegaly or nodules palpated; no lymphadenopathy.    RESP  Clear  P & A; w/o, wheezes/ rales/ or rhonchi. no accessory muscle use, no dullness to percussion  CARD: regularly irregular no m/r/g, no peripheral edema, pulses intact, no cyanosis or clubbing.  GI:   Soft & nt; nml bowel sounds; no organomegaly or masses detected.   Musco: Warm bil, no deformities or joint swelling noted.   Neuro: alert, no focal deficits noted.    Skin: Warm, no lesions or rashes    Lab Results:  CBC    Component Value Date/Time   WBC 5.1 04/05/2024 1501   RBC 4.70 04/05/2024 1501   HGB 14.3 04/05/2024 1501   HCT 42.6 04/05/2024 1501   PLT 248.0 04/05/2024 1501   MCV 90.7 04/05/2024 1501   MCH 30.9 02/22/2024 1723   MCHC 33.6 04/05/2024 1501   RDW 12.8 04/05/2024 1501   LYMPHSABS 1.6 04/05/2024 1501   MONOABS 0.3 04/05/2024 1501   EOSABS 0.0 04/05/2024 1501   BASOSABS 0.0 04/05/2024 1501    BMET    Component Value Date/Time   NA 136 02/22/2024 1723   K 4.0 02/22/2024 1723   CL 99 02/22/2024 1723   CO2 26 02/22/2024 1723   GLUCOSE 88 02/22/2024 1723   BUN 9 02/22/2024 1723   CREATININE 0.69 02/22/2024 1723   CALCIUM  9.2 02/22/2024 1723   GFRNONAA >60 02/22/2024 1723   GFRAA >60 02/26/2020 1626    BNP No results found for: BNP  ProBNP No results found for: PROBNP  Imaging: VAS US  LOWER EXTREMITY VENOUS REFLUX Result Date: 05/21/2024  Lower Venous Reflux Study Patient Name:  Angela Daniels  Date of Exam:   05/18/2024 Medical Rec #: [id number]       Accession #:    [id number] Date of Birth: Dec 29, 1945       Patient Gender: F Patient Age:   72 years Exam Location:  [address] Procedure:      VAS US  LOWER EXTREMITY VENOUS REFLUX Referring Phys: DEBBY ROBERTSON --------------------------------------------------------------------------------  Indications: Venous insufficiency.  Risk Factors: DVT Superficial thrombus right  GSV 02/28/24. Anticoagulation: Eliquis . Performing Technologist: [place] RVT  Examination Guidelines: A complete evaluation includes B-mode imaging, spectral Doppler, color Doppler,  and power Doppler as needed of all accessible portions of each vessel. Bilateral testing is considered an integral part of a complete examination. Limited examinations for reoccurring indications may be performed as noted. The reflux portion of the exam is performed with the patient in reverse Trendelenburg. Significant venous reflux is defined as >500 ms in the superficial venous system, and >1 second in the deep venous system.  Venous Reflux Times +--------------+---------+------+-----------+------------+--------+ RIGHT         Reflux NoRefluxReflux TimeDiameter cmsComments                         Yes                                  +--------------+---------+------+-----------+------------+--------+ CFV                     yes   >1 second                      +--------------+---------+------+-----------+------------+--------+ FV mid        no                                             +--------------+---------+------+-----------+------------+--------+ Popliteal     no                                             +--------------+---------+------+-----------+------------+--------+ GSV at SFJ              yes    >500 ms      0.6              +--------------+---------+------+-----------+------------+--------+ GSV prox thigh          yes    >500 ms      0.57             +--------------+---------+------+-----------+------------+--------+ GSV mid thigh           yes    >500 ms      0.49             +--------------+---------+------+-----------+------------+--------+ GSV dist thigh          yes    >500 ms      0.42             +--------------+---------+------+-----------+------------+--------+ GSV at knee             yes    >500 ms      0.58              +--------------+---------+------+-----------+------------+--------+ GSV prox calf           yes    >500 ms      0.46             +--------------+---------+------+-----------+------------+--------+ SSV at [facility]              yes    >500 ms      0.25             +--------------+---------+------+-----------+------------+--------+ SSV prox calf no  0.22             +--------------+---------+------+-----------+------------+--------+ SSV mid calf  no                            0.25             +--------------+---------+------+-----------+------------+--------+   Summary: Right: - No evidence of deep vein thrombosis seen in the right lower extremity, from the common femoral through the popliteal veins. - No evidence of superficial venous thrombosis in the right lower extremity. - Deep vein reflux in the CFV. - Superficial vein reflux in the SFJ and GSV, and the SSV at the [facility].  *See table(s) above for measurements and observations. Electronically signed by Gaile New MD on 05/21/2024 at 8:22:24 AM.    Final     Administration History     None          Latest Ref Rng & Units 05/28/2024   12:34 PM  PFT Results  FVC-Pre L 3.31  P  FVC-Predicted Pre % 93  P  FVC-Post L 3.43  P  FVC-Predicted Post % 96  P  Pre FEV1/FVC % % 73  P  Post FEV1/FCV % % 76  P  FEV1-Pre L 2.43  P  FEV1-Predicted Pre % 90  P  FEV1-Post L 2.60  P  DLCO uncorrected ml/min/mmHg 21.73  P  DLCO UNC% % 93  P  DLCO corrected ml/min/mmHg 21.17  P  DLCO COR %Predicted % 91  P  DLVA Predicted % 115  P  TLC L 5.76  P  TLC % Predicted % 96  P  RV % Predicted % 98  P    P Preliminary result    No results found for: NITRICOXIDE   Assessment & Plan:  Angela Daniels is a 78 y/o female with PMH of HTN and bronchiectasis who presents today for follow up.  She was seen in our office on 04/05/2024 for initial consultation after an incidental finding of bilateral mid zone  bronchiectasis on chest CT.  Her main complaints have been fatigue, dyspnea on exertion, lower chest tightness, and difficulty getting her ADLs done associated with dizziness and lightheadedness.  Thus far her workup has been negative with the exception of a positive ANA, echo with dilated right atrium, and ongoing hypertension alternating with hypotension. Assessment & Plan Bronchiectasis without complication (HCC) -  Incidental finding on Chest CT and patient's prior DOE has resolved -  Normal PFTs but some response to bronchodilator though not clinically significant (7% improvement) -  Trial of Albuterol  if dyspnea recurs; if suspicion remains for asthma, could consider methacholine challenge test -  Previous CT Angio negative for PE -  Discussed possibility of bronchoscopy to evaluate mucus impaction; patient would like to defer  Dizziness  -  Alternating hypo/hypertension concerning for autonomic dysfunction -  Ongoing workup with multiple specialties -  No apparent pulmonary cause  Dyspnea on exertion -  Seems to have improved since last visit; only known differences are that she is on Eliquis  and Olmesartan  since her last visit   Return in about 3 months (around 08/28/2024).  Candis Dandy, PA-C 05/28/2024

## 2024-05-28 NOTE — Patient Instructions (Signed)
 Full PFT performed today.

## 2024-06-05 NOTE — Progress Notes (Signed)
 Cardiology Office Note:    Date:  06/12/2024   ID:  Angela Daniels, DOB [birth year], MRN [id number]  PCP:  Ozell Heron HERO, MD  Cardiologist:  Lonni Cash, MD     Referring MD: Ozell Heron HERO, MD   Chief Complaint: follow-up of hypertension/ orthostatic hypotension   History of Present Illness:    Angela Daniels is a 78 y.o. 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 Cash and presents today for follow-up of hypertension and orthostatic hypotension.  Patient moved from [place]  to [facility] in 02/2023. She was recently referred to Dr. Cash in 10/2023 after a recent ED visit for chest pain. Work-up in the ED was unremarkable. Prior coronary calcium  score in 06/2023 was 8 (32nd percentile for age and sex). At initial visit, she reported chest pressure at rest and with exertion as well as palpitations and dyspnea. Recent cardiac monitor in 08/2023 showed normal sinus rhythm with one 4 beat run of NSVT, a couple of short runs of SVT (longest run 13 beats), and rare PACs/ PVCs but no significant arrhythmias. Amlodipine  was stopped and she was switched to Cardizem  CD instead. Coronary CTA and Echo were ordered for further evaluation. Coronary CTA showed a coronary calcium  score of 12.4 (34th percentile for age and sex), total plaque volume of 80 mm3 (24th percentile for age and sex), and only minimal plaque in the LAD and Diag but no obstructive CAD. 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 Echo showed LVEF of 60-65% with normal wall motion and diastolic parameters, normal RV function,  mild biatrial enlargement, mild MR, moderate TR, and a small pericardial effusion.    She was seen in the ED on 02/28/2024 further evaluation of dizziness and near syncope as well as some bilateral lower upper abdominal/ lower chest pain that wrapped around to her back. EKG showed no acute ischemic changes. High-sensitivity troponin negative x2. Lipase and LFTs were normal. Head CT, chest CTA, and abdominal/ pelvic CT were ordered and showed no acute findings to explain symptoms. Of note, CTA showed no evidence of aortic aneurysm or dissection. Ascending aorta was borderline in size and prior aneurysmal measurements appear to have been from axial slices obtained at obliquity exaggerating the diameter.    Right lower extremity venous doppler on 02/28/2024 showed findings consistent with a chronic superficial vein thrombosis involving the right great saphenous vein and right superficial veins/ varicosities. She was started on Eliquis  by her PCP's office.    She was seen by me on 7/28/22025 at which time she had multiple concerns but the main one had to due with weakness, dizziness, and feeling off balance. From a cardiac standpoint, she was relatively stable. She continued to report occasional episodes of atypical chest pain but it did not sound cardiac. Dizziness sound vasovagal or orthostatic in nature and conservative measures were discussed.    She was then seen by Neurology on 03/15/2024 for dizziness who reviewed recent imaging and did not find a neurological explanation for her symptoms. She was referred to Psychology for evaluation and management of stress.    She has continued to have symptoms of dizziness and weakness. She has been  keeping a track of her BP at home. Home BP log did show some soft BP and some orthostatics readings so Olmesartan  was decreased and then ultimately stopped completely. She also sent us  Kardia mobile strips during episodes of dizziness which showed sinus rhythm.    She saw  Pulmonology in 03/2024 for further evaluation of dyspnea. Repeat Echo and PFTs were ordered as well as extensive lab work. Labs were unremarkable. Echo showed LVEF of 55-60% with grade 1 diastolic dysfunction, mildly enlarged RV with normal RV function, severely dilated left atrium, mild MR, moderate TR, normal PASP, and small pericardial effusion.   She was last seen by me on 05/18/2024 due to concerns about her BP at home. For the most part, her BP was well controlled. She was still having basically constant dizziness but It did improve some having she stopped Olmesartan . However, she restarted Olmesartan  10mg  daily on her own due to to higher BP readings. She had some mild worsening of her dizziness but it was still overall better than before. She was working with PT and was referred to ENT. Olmesartan  was decreased to 5mg  daily.    Patient presents today for follow-up. Overall, she is doing much better. She is still having some dizziness on Olmesartan  5mg  but it is much better and tolerable. She has been going to PT for vestibular therapy and this is helping a lot. However, she says not two days are the same and certain activities, such as gardening and working in the garage where she is up and down a lot, do make symptoms worse. She has not tried compressions stockings yet but did note that symptoms were better when she was wearing exercise pants that were more snug. She continues to have atypical chest pain that she describes as bandlike sensation at rest but this is unchanged and does not sound cardiac in nature. No significant shortness of breath, orthopnea, PND, or edema. No palpitations or syncope. She reports a funny feeling in her feet like a numbness sensation and is worried about the circulation to her legs. However, she denies any claudication.   Reviewed home BP log. She has occasional systolic BP readings in the 90s and 150s but mostly well controlled in the 110s to 140s.    EKGs/Labs/Other Studies Reviewed:    The following studies were reviewed:  Monitor 09/13/2023 to 09/27/2023: NSR with sinus brady (46/min) and sinus tachy (194/min), ave 71/min. One episodes of NSVT, 4 beats at 194/min. NS SVT, 6 beats at 190 and 13 beats at 100/min. Rare PVC's and PAC's (<1%) No atrial fib or sustained VT or SVT. Symptoms associated with NSR _______________   Coronary CTA 11/09/2023: Impressions: 1. Coronary calcium  score of 12.4. This was 34 percentile for age-, sex, and race-matched controls. 2. Total plaque volume 80 mm3 which is 24 percentile for age- and sex-matched controls (calcified plaque 3 mm3; non-calcified plaque 77 mm3). TPV is (mild). 3. Normal coronary origin with right dominance. 4. Minimal plaque in the LAD and diagonal. 5. Mildly dilated aortic root (3.8 cm); mild aortic atherosclerosis. _______________   Chest CTA 02/22/2024: Summary: - No evidence of pulmonary emboli with borderline prominent pulmonary trunk. - Stable cardiomegaly with small pericardial effusion. - Aortic and coronary artery atherosclerosis. No aortic aneurysm or dissection. Borderline size ascending aorta 3.8 x 3.8 cm. Prior aneurysmal measurements appear to have been from axial slices obtained at obliquity exaggerating the diameter. - Chronic scarring, bronchiectasis and bronchial impactions in the anteromedial base of the lingula  and right middle lobe consistent with a chronic inflammatory/infectious process such as MAC. _______________   Echocardiogram 04/18/2024: Impressions: 1. Left ventricular ejection fraction, by estimation, is 55 to 60%. The  left ventricle has normal function. The left ventricle has no regional  wall motion abnormalities. Left ventricular diastolic parameters are  consistent with Grade I diastolic  dysfunction (impaired relaxation).   2. Right ventricular systolic function is normal. The right ventricular  size is mildly enlarged. There is  normal pulmonary artery systolic  pressure. The estimated right ventricular systolic pressure is 26.0 mmHg.   3. Left atrial size was severely dilated.   4. A small pericardial effusion is present. The pericardial effusion is  circumferential. There is no evidence of cardiac tamponade.   5. The mitral valve is grossly normal. Mild mitral valve regurgitation.   6. Tricuspid valve regurgitation is moderate.   7. The aortic valve is tricuspid. Aortic valve regurgitation is trivial.   8. There is borderline dilatation of the ascending aorta, measuring 36  mm.   9. The inferior vena cava is normal in size with greater than 50%  respiratory variability, suggesting right atrial pressure of 3 mmHg.   Comparison(s): No significant change from prior study. Prior images  reviewed side by side. _______________   Lower Extremity Venous Doppler 05/18/2024: Summary: Right:  - No evidence of deep vein thrombosis seen in the right lower extremity,  from the common femoral through the popliteal veins.  - No evidence of superficial venous thrombosis in the right lower  extremity.   - Deep vein reflux in the CFV.   - Superficial vein reflux in the SFJ and GSV, and the SSV at the [facility].    EKG:  EKG not ordered today.   Recent Labs: 02/22/2024: BUN 9; Creatinine, Ser 0.69; Magnesium 2.1; Potassium 4.0; Sodium 136; TSH 1.83 04/05/2024: Hemoglobin 14.3; Platelets 248.0 06/05/2024: ALT 15  Recent Lipid Panel    Component Value Date/Time   CHOL 200 (H) 06/05/2024 0828   TRIG 64 06/05/2024 0828   HDL 62 06/05/2024 0828   CHOLHDL 3.2 06/05/2024 0828   CHOLHDL 3 06/23/2023 1131   VLDL 14.6 06/23/2023 1131   LDLCALC 126 (H) 06/05/2024 0828    Physical Exam:    Vital Signs: BP (!) 130/90   Pulse 81   Ht 5' 10 (1.778 m)   Wt 174 lb 12.8 Daniels (79.3 kg)   SpO2 98%   BMI 25.08 kg/m     Wt Readings from Last 3 Encounters:  06/12/24 174 lb 12.8 Daniels (79.3 kg)  05/28/24 171 lb (77.6 kg)  05/18/24 171  lb (77.6 kg)     General: 78 y.o. female in no acute distress. HEENT: Normocephalic and atraumatic. Sclera clear.  Neck: Supple. No JVD. Heart: RRR. Distinct S1 and S2. No murmurs, gallops, or rubs.  Lungs: No increased work of breathing. Clear to ausculation bilaterally. No wheezes, rhonchi, or rales.  Extremities: No lower extremity edema.  Skin: Warm and dry. Neuro: No focal deficits. Psych: Normal affect. Responds appropriately.   Assessment:    1. Primary hypertension   2. Orthostatic hypotension   3. Chronic dizziness   4. Minimal non-obstructive CAD   5. Paroxysmal SVT (supraventricular tachycardia)   6. Mild mitral regurgitation   7. Moderate tricuspid regurgitation   8. Hyperlipidemia, unspecified hyperlipidemia type   9. 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 Numbness in feet     Plan:    Hypertension  Orthostatic Hypotension Patient has a history of hypertension. However, recently has struggled with soft BP and some orthostatic readings on home BP log. - Overall doing better on lower dose of Olmesartan . BP is mostly well controlled and dizziness has improved.  - Continue Olmesartan  5mg  daily. Can take 10mg  instead  if systolic BP >150s.  - She also has Hydralazine  10mg  as needed for BP >160/100 but she has never taken this and she would prefer to use Olmesartan  over this. - Home BP machine was reading about 20 mmHg higher for systolic BP compared to manual reading. Recommended patient get a new BP machine.    Chronic Dizziness History of Near Syncope Patient was seen in the ED in 02/2024 for dizziness/ near syncope. Work-up was unremarkable. Recent monitor in 08/2023 showed no significant arrhythmias. Recent Echo in 04/2024 showed normal LV function with no significant valvular disease.  - She continues to have dizziness but much improved with decrease in Olmesartan  and vestibular therapy. No recurrent near syncope. - Patient has had a  very thorough cardiac workup.  I do not think her dizziness is cardiac in nature.  She has been evaluated by Neurology as well but they did not feel this was neurologic either.  I suspect her dizziness is vestibular in nature with some orthostatic hypotension playing a role as well. Continue vestibular therapy. Recommend compression stockings. She is also scheduled to see ENT on 07/05/2024.   Minimal Non-Obstructive CAD Coronary CTA in 10/2023 showed a coronary calcium  score of 12.4 (34th percentile for age and sex), total plaque volume of 80 mm3 (24th percentile for age and sex), and only minimal plaque in the LAD and Diag but no obstructive CAD.  - No cardiac chest pain.  - Can hold off on Aspirin given only minimal CAD. - Continue statin. - Continue to increase physical activity as tolerated.    Paroxysmal SVT Patient has a history of palpitations. Recent monitor in 08/2023 showed normal sinus rhythm with one 4 beat run of NSVT, a couple of short runs of SVT (longest run 13 beats), and rare PACs/ PVCs but no significant arrhythmias. - Stable. No significant palpitations.  - Previously on Cardizem  CD 120mg  daily but she stopped this due to worsening dizziness.     Mild Mitral Regurgitation Moderate Tricuspid Regurgitation Noted on recent Echo in 04/2024.  - Can continue routine monitoring with serial Echos.    Hyperlipidemia Recent lipid panel on 06/05/2024: Total Cholesterol 200, Triglycerides 64, HDL 62, LDL 126. LDL goal <100. - She was previously started on Crestor  10mg  daily but stopped taking this when she was started on Paxlovid for a COVID infection several weeks ago. Will restart this but at 5mg  daily dosing given some prior leg stiffness with the 10mg  daily dosing.  - Will repeat lipid panel and LFTs in 3 months.    Chronic Superficial Venous Thrombosis Right lower extremity venous dopplers in 02/2024 showed findings consistent with chronic superficial vein thrombosis involving the  right great saphenous vein and right superficial veins/ varicosities but no DVT. She was started on Eliquis  by PCP's office. However, recent repeat right lower extremity venous doppler on 05/18/2024 showed no evidence of deep veins thrombosis or superficial venous thrombosis although there was deep vein reflux in the CFV and superficial vein reflux in the SFJ, GSV, and the SSV at the [facility].  - Eliquis  should be able to be discontinue now but will defer this to PCP.   Numbness in Feet Patient reports a  funny sensation like a numbness on the bottoms of both feet. She denies any claudication. She does not have a history of diabetes. - I think PAD is unlikely. She had good distal pedal pulses. However, she is concerned about the circulation in her legs. Therefore, will screen for PAD with lower extremity artery dopplers and ABIs.   Disposition: Follow up in about 3 months.    Signed, Aline FORBES Door, PA-C  06/12/2024 7:48 PM    [facility]

## 2024-06-06 ENCOUNTER — Telehealth: Payer: Self-pay

## 2024-06-06 ENCOUNTER — Other Ambulatory Visit (HOSPITAL_COMMUNITY): Payer: Self-pay

## 2024-06-06 LAB — HEPATIC FUNCTION PANEL
ALT: 15 [IU]/L (ref 0–32)
AST: 20 [IU]/L (ref 0–40)
Albumin: 4.4 g/dL (ref 3.8–4.8)
Alkaline Phosphatase: 51 [IU]/L (ref 49–135)
Bilirubin Total: 0.5 mg/dL (ref 0.0–1.2)
Bilirubin, Direct: 0.16 mg/dL (ref 0.00–0.40)
Total Protein: 6.9 g/dL (ref 6.0–8.5)

## 2024-06-06 LAB — LIPID PANEL
Chol/HDL Ratio: 3.2 ratio (ref 0.0–4.4)
Cholesterol, Total: 200 mg/dL — ABNORMAL HIGH (ref 100–199)
HDL: 62 mg/dL
LDL Chol Calc (NIH): 126 mg/dL — ABNORMAL HIGH (ref 0–99)
Triglycerides: 64 mg/dL (ref 0–149)
VLDL Cholesterol Cal: 12 mg/dL (ref 5–40)

## 2024-06-06 NOTE — Telephone Encounter (Signed)
 Pt ready for scheduling for PROLIA  on or after : 07/04/24  Option# 1: Buy/Bill (Office supplied medication)  Out-of-pocket cost due at time of clinic visit: $0  Number of injection/visits approved: ---  Primary: MEDICARE Prolia  co-insurance: 0% Admin fee co-insurance: 0%  Secondary: AARP-MEDSUP Prolia  co-insurance:  Admin fee co-insurance:   Medical Benefit Details: Date Benefits were checked: 06/06/24 Deductible: $257 Met of $257 Required/ Coinsurance: 0%/ Admin Fee: 0%  Prior Auth: N/A PA# Expiration Date:   # of doses approved: ----------------------------------------------------------------------- Option# 2- Med Obtained from pharmacy:  Pharmacy benefit: Copay [phone] (Paid to pharmacy) Admin Fee: 0% (Pay at clinic)  Prior Auth: N/A PA# Expiration Date:   # of doses approved:   If patient wants fill through the pharmacy benefit please send prescription to: [facility], and include estimated need by date in rx notes. Pharmacy will ship medication directly to the office.  Patient NOT eligible for Prolia  Copay Card. Copay Card can make patient's cost as little as $25. Link to apply: [email or web address]://[email or web address]  ** This summary of benefits is an estimation of the patient's out-of-pocket cost. Exact cost may very based on individual plan coverage.

## 2024-06-06 NOTE — Telephone Encounter (Signed)
 Prolia  VOB initiated via [email or web address]  Next Prolia  inj DUE: 07/04/24

## 2024-06-06 NOTE — Telephone Encounter (Signed)
 SABRA

## 2024-06-10 ENCOUNTER — Ambulatory Visit: Payer: Self-pay | Admitting: Student

## 2024-06-11 ENCOUNTER — Telehealth: Payer: Self-pay | Admitting: *Deleted

## 2024-06-11 NOTE — Telephone Encounter (Unsigned)
 Copied from CRM [id number]. Topic: General - Other >> May 23, 2024  5:00 PM Joesph B wrote: Reason for CRM: patient calling to speak to Idell. >> Jun 11, 2024 10:21 AM Avram MATSU wrote: Patient is calling for an update about her prolia  shot please advise

## 2024-06-11 NOTE — Addendum Note (Signed)
 Addended by: DIONISIO CAMELIA PARAS on: 06/11/2024 03:34 PM   Modules accepted: Orders

## 2024-06-11 NOTE — Telephone Encounter (Signed)
 Patient had pft in August and f/u with Candis afterwards. She is inquiring about results of PFT. Copied from CRM [id number]. Topic: Clinical - Lab/Test Results >> Jun 11, 2024 12:26 PM Russell PARAS wrote: Reason for CRM:   Pt has additional questions concerning her PFT results. She was advised at her last visit on 10/13 that her results were normal but after reading the report herself, she sees that the results may have been indicative of mild airway disease. She would like the nurse or provider to contact her and go through the results by breaking it down and explaining what everything means.   She also advised she had a COVID infection 2 weeks prior to her PFT testing and would like to know if this may have affected the results.   CB#  [phone]

## 2024-06-11 NOTE — Telephone Encounter (Signed)
 Her results may have been affected by her recent COVID infection.  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

## 2024-06-12 ENCOUNTER — Encounter: Payer: Self-pay | Admitting: Student

## 2024-06-12 ENCOUNTER — Ambulatory Visit: Attending: Student | Admitting: Student

## 2024-06-12 VITALS — BP 130/90 | HR 81 | Ht 70.0 in | Wt 174.8 lb

## 2024-06-12 DIAGNOSIS — I82811 Embolism and thrombosis of superficial veins of right lower extremities: Secondary | ICD-10-CM | POA: Insufficient documentation

## 2024-06-12 DIAGNOSIS — I951 Orthostatic hypotension: Secondary | ICD-10-CM | POA: Insufficient documentation

## 2024-06-12 DIAGNOSIS — I2510 Atherosclerotic heart disease of native coronary artery without angina pectoris: Secondary | ICD-10-CM | POA: Diagnosis not present

## 2024-06-12 DIAGNOSIS — I10 Essential (primary) hypertension: Secondary | ICD-10-CM | POA: Diagnosis present

## 2024-06-12 DIAGNOSIS — I471 Supraventricular tachycardia, unspecified: Secondary | ICD-10-CM | POA: Diagnosis present

## 2024-06-12 DIAGNOSIS — R42 Dizziness and giddiness: Secondary | ICD-10-CM | POA: Diagnosis not present

## 2024-06-12 DIAGNOSIS — I071 Rheumatic tricuspid insufficiency: Secondary | ICD-10-CM | POA: Insufficient documentation

## 2024-06-12 DIAGNOSIS — R200 Anesthesia of skin: Secondary | ICD-10-CM | POA: Insufficient documentation

## 2024-06-12 DIAGNOSIS — I340 Nonrheumatic mitral (valve) insufficiency: Secondary | ICD-10-CM | POA: Insufficient documentation

## 2024-06-12 DIAGNOSIS — E785 Hyperlipidemia, unspecified: Secondary | ICD-10-CM | POA: Insufficient documentation

## 2024-06-12 MED ORDER — ROSUVASTATIN CALCIUM 5 MG PO TABS
5.0000 mg | ORAL_TABLET | Freq: Every day | ORAL | Status: DC
Start: 2024-06-12 — End: 2024-07-09

## 2024-06-12 MED ORDER — OLMESARTAN MEDOXOMIL 5 MG PO TABS: 5.0000 mg | ORAL_TABLET | Freq: Every day | ORAL | 0 refills | Status: DC

## 2024-06-12 NOTE — Telephone Encounter (Signed)
 Patient advised per JC recommendation. NFN

## 2024-06-12 NOTE — Patient Instructions (Signed)
 Thank you for choosing [facility]!     Medication Instructions:  Restart the Crestor  5mg . Take one tablet daily.  You may continue to split the 10mg  Crestor  for now. *If you need a refill on your cardiac medications before your next appointment, please call your pharmacy*   Lab Work: In 3 months return for fasting labs....................SABRA LFT'S, LIPIDS If you have labs (blood work) drawn today and your tests are completely normal, you will receive your results only by: MyChart Message (if you have MyChart) OR A paper copy in the mail If you have any lab test that is abnormal or we need to change your treatment, we will call you to review the results.   Testing/Procedures: Your physician has requested that you have an ankle brachial index (ABI). During this test an ultrasound and blood pressure cuff are used to evaluate the arteries that supply the arms and legs with blood. Allow thirty minutes for this exam. There are no restrictions or special instructions.  Please note: We ask at that you not bring children with you during ultrasound (echo/ vascular) testing. Due to room size and safety concerns, children are not allowed in the ultrasound rooms during exams. Our front office staff cannot provide observation of children in our lobby area while testing is being conducted. An adult accompanying a patient to their appointment will only be allowed in the ultrasound room at the discretion of the ultrasound technician under special circumstances. We apologize for any inconvenience.        Provider:   Lonni Cash, MD     Follow-Up: At [facility], you and your health needs are our priority.  As part of our continuing mission to provide you with exceptional heart care, we have created designated Provider Care Teams.  These Care Teams include your primary Cardiologist (physician) and Advanced Practice Providers (APPs -  Physician Assistants and Nurse  Practitioners) who all work together to provide you with the care you need, when you need it. We recommend signing up for the patient portal called MyChart.  Sign up information is provided on this After Visit Summary.  MyChart is used to connect with patients for Virtual Visits (Telemedicine).  Patients are able to view lab/test results, encounter notes, upcoming appointments, etc.  Non-urgent messages can be sent to your provider as well.   To learn more about what you can do with MyChart, go to [email or web address].

## 2024-06-18 NOTE — Progress Notes (Unsigned)
 [facility] [place]  ASSESSMENT / PLAN: 78 y.o. female with chronic venous insufficiency.  This is producing no symptoms.  Encourage patient to do compression and elevation.  No vascular cause for near syncope seen during workup to date.  She can follow-up with me as needed.  CHIEF COMPLAINT: Near syncope  HISTORY OF PRESENT ILLNESS: Angela Daniels is a 78 y.o. female referred to clinic for evaluation of varicosities in the bilateral lower extremities.  This is associated with superficial venous thrombus which was treated with Eliquis .  Patient saw significant improvement while taking Eliquis  and the superficial thrombus has resolved.  Patient has had a long and challenging workup for near syncopal episode occurring in the spring.  She has undergone a multitude of tests for the same.  We reviewed her testing today in detail.  I counseled her about chronic venous insufficiency.  I explained to her that these findings were not likely to be contributory to her near-syncopal episode.  Past Medical History:  Diagnosis Date   Allergy     Arthritis    Hypertension    07/20/23   Osteoporosis    Positive TB test    Thoracic aortic aneurysm     Past Surgical History:  Procedure Laterality Date   TONSILLECTOMY     1954    Family History  Problem Relation Age of Onset   Hyperlipidemia Mother    Hypertension Mother    Hearing loss Mother    Kidney disease Father    Cancer Father        Bladder cancer   Hypertension Sister    Hypertension Brother    Cancer Brother     Social History   Socioeconomic History   Marital status: Widowed    Spouse name: Not on file   Number of children: 2   Years of education: Not on file   Highest education level: Bachelor's degree (e.g., BA, AB, BS)  Occupational History   Occupation: Retired runner, broadcasting/film/video  Tobacco Use   Smoking status: Never   Smokeless tobacco: Never  Vaping Use   Vaping status: Never Used  Substance and Sexual  Activity   Alcohol use: Yes    Comment: occasionally   Drug use: Never   Sexual activity: Not Currently  Other Topics Concern   Not on file  Social History Narrative   Not on file   Social Drivers of Health   Financial Resource Strain: Low Risk  (02/21/2024)   Overall Financial Resource Strain (CARDIA)    Difficulty of Paying Living Expenses: Not hard at all  Food Insecurity: No Food Insecurity (02/21/2024)   Hunger Vital Sign    Worried About Running Out of Food in the Last Year: Never true    Ran Out of Food in the Last Year: Never true  Transportation Needs: No Transportation Needs (02/21/2024)   PRAPARE - Administrator, Civil Service (Medical): No    Lack of Transportation (Non-Medical): No  Physical Activity: Sufficiently Active (02/21/2024)   Exercise Vital Sign    Days of Exercise per Week: 4 days    Minutes of Exercise per Session: 60 min  Stress: Patient Declined (09/09/2023)   [employer] of Occupational Health - Occupational Stress Questionnaire    Feeling of Stress : Patient declined  Social Connections: Unknown (02/21/2024)   Social Connection and Isolation Panel    Frequency of Communication with Friends and Family: Not on file    Frequency of Social Gatherings with Friends  and Family: Not on file    Attends Religious Services: Not on file    Active Member of Clubs or Organizations: Yes    Attends Club or Organization Meetings: More than 4 times per year    Marital Status: Widowed  Intimate Partner Violence: Not At Risk (09/09/2023)   Humiliation, Afraid, Rape, and Kick questionnaire    Fear of Current or Ex-Partner: No    Emotionally Abused: No    Physically Abused: No    Sexually Abused: No    Allergies  Allergen Reactions   Shellfish Allergy  Shortness Of Breath   Iodine     Severe burning sensation   Latex     Cannot tolerate for long periods of time.   Sulfa Antibiotics     Current Outpatient Medications  Medication Sig Dispense Refill    albuterol  (VENTOLIN  HFA) 108 (90 Base) MCG/ACT inhaler Inhale 2 puffs into the lungs every 6 (six) hours as needed for wheezing or shortness of breath. 8 g 6   cholecalciferol (VITAMIN D3) 25 MCG (1000 UNIT) tablet Take by mouth daily.     denosumab  (PROLIA ) 60 MG/ML SOSY injection Inject 60 mg into the skin every 6 (six) months.     hydrALAZINE  (APRESOLINE ) 10 MG tablet Take 1 tablet (10 mg total) by mouth every 8 (eight) hours as needed (light headedness, dizziness with a bp above 160/100).     olmesartan  (BENICAR ) 5 MG tablet Take 1 tablet (5 mg total) by mouth daily. 30 tablet 0   rosuvastatin  (CRESTOR ) 5 MG tablet Take 1 tablet (5 mg total) by mouth daily.     Current Facility-Administered Medications  Medication Dose Route Frequency Provider Last Rate Last Admin   [START ON 07/04/2024] denosumab  (PROLIA ) injection 60 mg  60 mg Subcutaneous Q6 months Ozell Heron HERO, MD        PHYSICAL EXAM Vitals:   06/19/24 1053  BP: (!) 143/92  Pulse: 79  Temp: 98.5 F (36.9 C)  SpO2: 98%  Weight: 173 lb (78.5 kg)  Height: 5' 10 (1.778 m)   Well-appearing woman in no distress Regular rate and rhythm Unlabored breathing 2+ dorsalis pedis pulses bilaterally  PERTINENT LABORATORY AND RADIOLOGIC DATA  Most recent CBC    Latest Ref Rng & Units 04/05/2024    3:01 PM 02/22/2024    5:23 PM 02/22/2024   12:11 PM  CBC  WBC 4.0 - 10.5 K/uL 5.1  6.8  6.5   Hemoglobin 12.0 - 15.0 g/dL 85.6  85.9  85.2   Hematocrit 36.0 - 46.0 % 42.6  40.8  43.2   Platelets 150.0 - 400.0 K/uL 248.0  207  238.0      Most recent CMP    Latest Ref Rng & Units 06/05/2024    8:28 AM 02/22/2024    6:39 PM 02/22/2024    5:23 PM  CMP  Glucose 70 - 99 mg/dL   88   BUN 8 - 23 mg/dL   9   Creatinine 9.55 - 1.00 mg/dL   9.30   Sodium 864 - 854 mmol/L   136   Potassium 3.5 - 5.1 mmol/L   4.0   Chloride 98 - 111 mmol/L   99   CO2 22 - 32 mmol/L   26   Calcium  8.9 - 10.3 mg/dL   9.2   Total Protein 6.0 - 8.5 g/dL  6.9  8.7    Total Bilirubin 0.0 - 1.2 mg/dL 0.5  0.5    Alkaline Phos  49 - 135 IU/L 51  64    AST 0 - 40 IU/L 20  34    ALT 0 - 32 IU/L 15  20      Renal function CrCl cannot be calculated (Patient's most recent lab result is older than the maximum 21 days allowed.).  No results found for: HGBA1C  LDL Chol Calc (NIH)  Date Value Ref Range Status  06/05/2024 126 (H) 0 - 99 mg/dL Final    CLINICAL DATA:  Syncope   EXAM: BILATERAL CAROTID DUPLEX ULTRASOUND   TECHNIQUE: Elnor scale imaging, color Doppler and duplex ultrasound were performed of bilateral carotid and vertebral arteries in the neck.   COMPARISON:  None Available.   FINDINGS: Criteria: Quantification of carotid stenosis is based on velocity parameters that correlate the residual internal carotid diameter with NASCET-based stenosis levels, using the diameter of the distal internal carotid lumen as the denominator for stenosis measurement.   The following velocity measurements were obtained:   RIGHT ICA: 73/21 cm/sec CCA: 75/14 cm/sec   SYSTOLIC ICA/CCA RATIO:  1.0   ECA:  69 cm/sec   LEFT   ICA: 72/24 cm/sec   CCA: 68/12 cm/sec   SYSTOLIC ICA/CCA RATIO:  1.1   ECA:  78 cm/sec   RIGHT CAROTID ARTERY: No significant atherosclerotic plaque or evidence of stenosis in the internal carotid artery.   RIGHT VERTEBRAL ARTERY:  Patent with normal antegrade flow.   LEFT CAROTID ARTERY: No significant atherosclerotic plaque or evidence of stenosis in the internal carotid artery.   LEFT VERTEBRAL ARTERY:  Patent with normal antegrade flow.   IMPRESSION: Normal bilateral carotid duplex ultrasound.     Electronically Signed   By: Wilkie Lent M.D.   On: 03/18/2024 10:21  Lower Venous Reflux Study   Patient Name:  Angela Daniels  Date of Exam:   05/18/2024  Medical Rec #: [id number]       Accession #:    [id number]  Date of Birth: [birth year]       Patient Gender: F  Patient Age:   67 years  Exam  Location:  [address]  Procedure:      VAS US  LOWER EXTREMITY VENOUS REFLUX  Referring Phys: DEBBY ROBERTSON    ---------------------------------------------------------------------------  -----    Indications: Venous insufficiency.    Risk Factors: DVT Superficial thrombus right GSV 02/28/24.  Anticoagulation: Eliquis .  Performing Technologist: [place] RVT     Examination Guidelines: A complete evaluation includes B-mode imaging,  spectral  Doppler, color Doppler, and power Doppler as needed of all accessible  portions  of each vessel. Bilateral testing is considered an integral part of a  complete  examination. Limited examinations for reoccurring indications may be  performed  as noted. The reflux portion of the exam is performed with the patient in  reverse Trendelenburg.  Significant venous reflux is defined as >500 ms in the superficial venous  system, and >1 second in the deep venous system.     Venous Reflux Times  +--------------+---------+------+-----------+------------+--------+  RIGHT        Reflux NoRefluxReflux TimeDiameter cmsComments                          Yes                                   +--------------+---------+------+-----------+------------+--------+  CFV  yes   >1 second                       +--------------+---------+------+-----------+------------+--------+  FV mid        no                                              +--------------+---------+------+-----------+------------+--------+  Popliteal    no                                              +--------------+---------+------+-----------+------------+--------+  GSV at [facility]              yes    >500 ms      0.6               +--------------+---------+------+-----------+------------+--------+  GSV prox thigh          yes    >500 ms      0.57               +--------------+---------+------+-----------+------------+--------+  GSV mid thigh           yes    >500 ms      0.49              +--------------+---------+------+-----------+------------+--------+  GSV dist thigh          yes    >500 ms      0.42              +--------------+---------+------+-----------+------------+--------+  GSV at knee             yes    >500 ms      0.58              +--------------+---------+------+-----------+------------+--------+  GSV prox calf           yes    >500 ms      0.46              +--------------+---------+------+-----------+------------+--------+  SSV at [facility]              yes    >500 ms      0.25              +--------------+---------+------+-----------+------------+--------+  SSV prox calf no                            0.22              +--------------+---------+------+-----------+------------+--------+  SSV mid calf  no                            0.25              +--------------+---------+------+-----------+------------+--------+         Summary:  Right:  - No evidence of deep vein thrombosis seen in the right lower extremity,  from the common femoral through the popliteal veins.  - No evidence of superficial venous thrombosis in the right lower  extremity.   - Deep vein reflux in the CFV.   - Superficial vein reflux in the SFJ and GSV, and the SSV at the [facility].    *See table(s) above  for measurements and observations.   Electronically signed by Gaile New MD on 05/21/2024 at 8:22:24 AM.   Debby SAILOR. Magda, MD FACS [facility] Phone Number: [phone] 06/18/2024 4:47 PM   Total time spent on preparing this encounter including chart review, data review, collecting history, examining the patient, and coordinating care: 60 min  Portions of this report may have been transcribed using voice recognition software.  Every effort has been made to  ensure accuracy; however, inadvertent computerized transcription errors may still be present.

## 2024-06-19 ENCOUNTER — Encounter: Payer: Self-pay | Admitting: Vascular Surgery

## 2024-06-19 ENCOUNTER — Ambulatory Visit: Attending: Vascular Surgery | Admitting: Vascular Surgery

## 2024-06-19 ENCOUNTER — Encounter (HOSPITAL_COMMUNITY)

## 2024-06-19 VITALS — BP 143/92 | HR 79 | Temp 98.5°F | Ht 70.0 in | Wt 173.0 lb

## 2024-06-19 DIAGNOSIS — I872 Venous insufficiency (chronic) (peripheral): Secondary | ICD-10-CM | POA: Insufficient documentation

## 2024-06-21 ENCOUNTER — Ambulatory Visit: Admitting: Neurology

## 2024-06-26 ENCOUNTER — Ambulatory Visit (INDEPENDENT_AMBULATORY_CARE_PROVIDER_SITE_OTHER)
Admission: RE | Admit: 2024-06-26 | Discharge: 2024-06-26 | Disposition: A | Discharge status: Home / Self Care | Source: Ambulatory Visit | Attending: Family Medicine | Admitting: Family Medicine

## 2024-06-26 DIAGNOSIS — M810 Age-related osteoporosis without current pathological fracture: Secondary | ICD-10-CM | POA: Diagnosis not present

## 2024-06-27 ENCOUNTER — Inpatient Hospital Stay: Admission: RE | Admit: 2024-06-27 | Source: Ambulatory Visit

## 2024-06-27 ENCOUNTER — Other Ambulatory Visit

## 2024-06-28 ENCOUNTER — Ambulatory Visit: Payer: Self-pay | Admitting: Family Medicine

## 2024-07-05 ENCOUNTER — Ambulatory Visit (INDEPENDENT_AMBULATORY_CARE_PROVIDER_SITE_OTHER): Admitting: Otolaryngology

## 2024-07-05 ENCOUNTER — Ambulatory Visit: Admitting: Family Medicine

## 2024-07-05 ENCOUNTER — Ambulatory Visit (INDEPENDENT_AMBULATORY_CARE_PROVIDER_SITE_OTHER): Admitting: Audiology

## 2024-07-05 VITALS — BP 172/92 | HR 70 | Ht 70.0 in | Wt 170.0 lb

## 2024-07-05 DIAGNOSIS — H906 Mixed conductive and sensorineural hearing loss, bilateral: Secondary | ICD-10-CM | POA: Diagnosis not present

## 2024-07-05 DIAGNOSIS — R42 Dizziness and giddiness: Secondary | ICD-10-CM | POA: Diagnosis not present

## 2024-07-05 DIAGNOSIS — I9589 Other hypotension: Secondary | ICD-10-CM

## 2024-07-05 DIAGNOSIS — H6123 Impacted cerumen, bilateral: Secondary | ICD-10-CM

## 2024-07-05 DIAGNOSIS — R2689 Other abnormalities of gait and mobility: Secondary | ICD-10-CM

## 2024-07-05 DIAGNOSIS — H903 Sensorineural hearing loss, bilateral: Secondary | ICD-10-CM

## 2024-07-05 MED ORDER — MECLIZINE HCL 12.5 MG PO TABS: 12.5000 mg | ORAL_TABLET | Freq: Three times a day (TID) | ORAL | 0 refills | Status: AC | PRN

## 2024-07-05 NOTE — Progress Notes (Signed)
 Dear Dr. Ozell, Here is my assessment for our mutual patient, Angela Daniels. Thank you for allowing me the opportunity to care for your patient. Please do not hesitate to contact me should you have any other questions. Sincerely, Dr. Eldora Blanch  Otolaryngology Clinic Note Referring provider: Dr. Ozell HPI:  Initial visit (06/2024): Discussed the use of AI scribe software for clinical note transcription with the patient, who gave verbal consent to proceed.  History of Present Illness Angela Daniels is a 78 year old female with orthostatic hypotension and tinnitus who presents with dizziness and balance issues.  She experiences dizziness and balance issues without vertigo, with episodes lasting for hours. Symptoms worsen in environments like grocery stores or low-light areas. Vestibular therapy over the past two months has provided some improvement. Some near syncopal episodes. She experiences tinnitus and sometimes a sensation of fullness which is bilateral during dizziness episodes but no N/V. She is sensitive to low light environments, which exacerbate her symptoms. No facial weakness, numbness. No headache.   Patient denies: ear pain, drainage; does have chronic bilateral non-pulsatile tinnitus and bilateral hearing loss Patient additionally denies: deep pain in ear canal, eustachian tube symptoms such as popping, crackling, sensitive to pressure changes Patient also denies barotrauma, vestibular suppressant use, ototoxic medication use Prior ear surgery: no No h/o frequent ear infections. Not on any nasal medications  ENT Surgery: Tonsillectomy Personal or FHx of bleeding dz or anesthesia difficulty: no  AP/AC: Eliquis   Tobacco: no  PMHx CAD, SVT, Venous Thrombosis RLE, HTN with orthostatic hypotension, HLD, Arthritis. Bronchiectasis, Venous insufficiency  Independent Review of Additional Tests or Records:  Aline Door (05/18/2024): noted concern for CP with dizziness and  near syncope. Dizziness sounded vasovagal or orthostatic. Keeping track of her BP, some soft BP and ultimately stopped anti-HTN; noted BP does drop with standing. Working with PT with improvement. Noted atypical chest pain. Dx: Orhtostatic hypotension; Rx: decrease olmesartan ; did not think dizziness is cardiac in nature - suspect vestibular with some orthostatic hypotension Neurology Dr. Gregg 03/15/2024: dizziness, near syncope, brain fog, sx improving; Does not seem like sx are neurological in nature.  CBC and BMP  (04/05/2024, 03/2024): WBC 5.1, BUN/Cr 9/0.69 CTH 02/22/2024 independently interpreted with respect to ears: b/l cerumen impaction; b/l mastoids and ME well aerated, b/l otic capsules without pathology or ossicular chain but cuts thick so suboptimal 06/2024 Audiogram was independently reviewed and interpreted by me and it reveals - A/A tymps; WRT AD/AS 92/96% AD/AS; noted mixed hearing loss low frequency predominantly with asymmetric thresholds high frequency; carhart AD?   SNHL= Sensorineural hearing loss   PMH/Meds/All/SocHx/FamHx/ROS:   Past Medical History:  Diagnosis Date   Allergy     Arthritis    Hypertension    07/20/23   Osteoporosis    Positive TB test    Thoracic aortic aneurysm      Past Surgical History:  Procedure Laterality Date   TONSILLECTOMY     1954    Family History  Problem Relation Age of Onset   Hyperlipidemia Mother    Hypertension Mother    Hearing loss Mother    Kidney disease Father    Cancer Father        Bladder cancer   Hypertension Sister    Hypertension Brother    Cancer Brother      Social Connections: Unknown (07/05/2024)   Social Connection and Isolation Panel    Frequency of Communication with Friends and Family: More than three times a week  Frequency of Social Gatherings with Friends and Family: More than three times a week    Attends Religious Services: Not on file    Active Member of Clubs or Organizations: Yes     Attends Banker Meetings: More than 4 times per year    Marital Status: Widowed      Current Outpatient Medications:    albuterol  (VENTOLIN  HFA) 108 (90 Base) MCG/ACT inhaler, Inhale 2 puffs into the lungs every 6 (six) hours as needed for wheezing or shortness of breath., Disp: 8 g, Rfl: 6   cholecalciferol (VITAMIN D3) 25 MCG (1000 UNIT) tablet, Take by mouth daily., Disp: , Rfl:    denosumab  (PROLIA ) 60 MG/ML SOSY injection, Inject 60 mg into the skin every 6 (six) months., Disp: , Rfl:    hydrALAZINE  (APRESOLINE ) 10 MG tablet, Take 1 tablet (10 mg total) by mouth every 8 (eight) hours as needed (light headedness, dizziness with a bp above 160/100)., Disp: , Rfl:    meclizine  (ANTIVERT ) 12.5 MG tablet, Take 1 tablet (12.5 mg total) by mouth 3 (three) times daily as needed for dizziness., Disp: 30 tablet, Rfl: 0   olmesartan  (BENICAR ) 5 MG tablet, Take 1 tablet by mouth once daily, Disp: 90 tablet, Rfl: 3  Current Facility-Administered Medications:    denosumab  (PROLIA ) injection 60 mg, 60 mg, Subcutaneous, Q6 months, Ozell Heron HERO, MD   [START ON 01/07/2025] denosumab  (PROLIA ) injection 60 mg, 60 mg, Subcutaneous, Q6 months, Ozell Heron HERO, MD   Physical Exam:   BP (!) 172/92 (BP Location: Right Arm, Patient Position: Sitting, Cuff Size: Large)   Pulse 70   Ht 5' 10 (1.778 m)   Wt 170 lb (77.1 kg)   SpO2 97%   BMI 24.39 kg/m   Salient findings:  CN II-XII intact Given history and complaints, ear microscopy was indicated and performed for evaluation with findings as below in physical exam section and in procedures; bilateral cerumen impaction, Bilateral EAC clear and TM intact with well pneumatized middle ear spaces Weber 512: reported mid Rinne 512: AC > BC b/l  No nystagmus grossly Anterior rhinoscopy: Septum intact; bilateral inferior turbinates without significant hypertrophy No lesions of oral cavity/oropharynx No obviously palpable neck  masses/lymphadenopathy/thyromegaly No respiratory distress or stridor No gross nystagmus, gait not broad based, head shake neg; Romberg neg  Seprately Identifiable Procedures:  Prior to initiating any procedures, risks/benefits/alternatives were explained to the patient and verbal consent obtained. Procedure: Bilateral ear microscopy and cerumen removal using microscope (CPT [phone]) - Mod 25 Pre-procedure diagnosis: Cerumen impaction bilateral external ears Post-procedure diagnosis: same Indication: Bilateral cerumen impaction; given patient's otologic complaints and history as well as for improved and comprehensive examination of external ear and tympanic membrane, bilateral otologic examination using microscope was performed and impacted cerumen removed  Procedure: Patient was placed semi-recumbent. Both ear canals were examined using the microscope with findings above. Impacted Cerumen removed on left and on right using suction and currette with improvement in EAC examination and patency. Patient tolerated the procedure well.  Impression & Plans:  Angela Daniels is a 78 y.o. female with:  1. Asymmetrical sensorineural hearing loss   2. Imbalance   3. Dizziness   4. Other specified hypotension   5. Mixed hearing loss, bilateral    Dizziness/Imbalance with b/l mixed hearing loss - Balance disturbance with dizziness, not associated with vertigo. Possible causes include orthostatic hypotension and vestibular migraine. Neurological evaluation ruled out central causes. Vestibular rehabilitation showed improvement. As such, we had  a long conversation and discussed options --- formal vestib testing, vestibular rehab, imaging from hearing standpoint. Given improvement, she would like to continue vest rehab and imaging; if no improvement, will send for formal vestib testing. Recommend continue to keep diary and work with PCP re: orthostasis - Ordered MRI of the brain and CT scan of the ear. -  Prescribed medication for prolonged dizziness. - Continue vestibular rehabilitation therapy.  F/u 6 weeks  See below regarding exact medications prescribed this encounter including dosages and route: Meds ordered this encounter  Medications   meclizine  (ANTIVERT ) 12.5 MG tablet    Sig: Take 1 tablet (12.5 mg total) by mouth 3 (three) times daily as needed for dizziness.    Dispense:  30 tablet    Refill:  0      Thank you for allowing me the opportunity to care for your patient. Please do not hesitate to contact me should you have any other questions.  Sincerely, Eldora Blanch, MD Otolaryngologist (ENT), [facility] Phone: [phone] Fax: [phone]  07/21/2024, 10:32 AM   MDM:  I have personally spent 61 minutes involved in face-to-face and non-face-to-face activities for this patient on the day of the visit.  Professional time spent excludes any procedures performed but includes the following activities, in addition to those noted in the documentation: preparing to see the patient (review of outside documentation and results), performing a medically appropriate examination, extensive counseling, ordering medications (meclizine ), documenting in the electronic health record, independently interpreting results (CT).

## 2024-07-05 NOTE — Progress Notes (Signed)
  [address], [address] [place], [place] [address] [phone]  Audiological Evaluation    Name: Angela Daniels     DOB:   [birth year]      MRN:   [id number]                                                                                     Service Date: 07/05/2024     Accompanied by: unaccompanied   Patient comes today after Dr. Tobie, ENT sent a referral for a hearing evaluation due to concerns with balance and hearing loss.   Symptoms Yes Details  Hearing loss  [x]  Reports fluctuating hearing loss.   Patient reported a sudden change  twice  ( one was 1 year ago and second time was on September 2025 after COVID-19)  Tinnitus  [x]  Both ears  Ear pain/ infections/pressure  []    Balance problems  [x]  July 3, 20225 almost passed out, a couple days later. Goes to physical therapy and reports an improvement. Reports some off balance sensation and   Noise exposure history  []    Previous ear surgeries  []    Family history of hearing loss  []    Amplification  []    Other  [x]  Recently also started blood pressure medication    Otoscopy: Right ear: Clear external ear canal and notable landmarks visualized on the tympanic membrane. Left ear:  Clear external ear canal and notable landmarks visualized on the tympanic membrane.  Of note- hearing was rechecked after wax was removed from her ears.  Tympanometry: Right ear: Normal external ear canal volume with normal middle ear pressure and tympanic membrane compliance (Type A). Findings are suggestive of normal middle ear function. Left ear: Normal external ear canal volume with normal middle ear pressure and tympanic membrane compliance (Type A). Findings are suggestive of normal middle ear function.   Hearing Evaluation The hearing test results were completed under headphones and inserts and results are deemed to be of good reliability. Test technique:  conventional    Pure tone Audiometry: Right ear- Borderline normal to moderate  sensorineural hearing loss from 250 Hz - 8000 Hz. Left ear-  Mild to moderately severe sensorineural hearing loss from 125 Hz - 8000 Hz.  Speech Audiometry: Right ear- Speech Reception Threshold (SRT) was obtained at 40 dBHL. Left ear-Speech Reception Threshold (SRT) was obtained at 40 dBHL.   Word Recognition Score Tested using NU-6 (recorded) Right ear: 92% was obtained at a presentation level of 80 dBHL with contralateral masking which is deemed as  excellent. Left ear: 96% was obtained at a presentation level of 80 dBHL with contralateral masking which is deemed as  excellent.   Impression: There is a significant difference in pure-tone thresholds between ears. See audiogram for details.   Recommendations: Follow up with ENT as scheduled for today. Return for a hearing evaluation if concerns with hearing changes arise or per MD recommendation. Consider a communication needs assessment after medical clearance for hearing aids is obtained.   Khrista Braun MARIE LEROUX-MARTINEZ, AUD

## 2024-07-06 ENCOUNTER — Ambulatory Visit: Admitting: Family Medicine

## 2024-07-07 ENCOUNTER — Other Ambulatory Visit: Payer: Self-pay | Admitting: Student

## 2024-07-09 ENCOUNTER — Encounter: Payer: Self-pay | Admitting: Family Medicine

## 2024-07-09 ENCOUNTER — Ambulatory Visit (INDEPENDENT_AMBULATORY_CARE_PROVIDER_SITE_OTHER): Admitting: Family Medicine

## 2024-07-09 ENCOUNTER — Ambulatory Visit

## 2024-07-09 ENCOUNTER — Ambulatory Visit
Admission: RE | Admit: 2024-07-09 | Discharge: 2024-07-09 | Disposition: A | Discharge status: Home / Self Care | Source: Ambulatory Visit | Attending: Family Medicine | Admitting: Family Medicine

## 2024-07-09 VITALS — BP 120/84 | HR 73 | Temp 97.5°F | Ht 70.0 in | Wt 172.1 lb

## 2024-07-09 DIAGNOSIS — I10 Essential (primary) hypertension: Secondary | ICD-10-CM

## 2024-07-09 DIAGNOSIS — F419 Anxiety disorder, unspecified: Secondary | ICD-10-CM | POA: Diagnosis not present

## 2024-07-09 DIAGNOSIS — Z1231 Encounter for screening mammogram for malignant neoplasm of breast: Secondary | ICD-10-CM

## 2024-07-09 DIAGNOSIS — M810 Age-related osteoporosis without current pathological fracture: Secondary | ICD-10-CM

## 2024-07-09 DIAGNOSIS — Z1211 Encounter for screening for malignant neoplasm of colon: Secondary | ICD-10-CM

## 2024-07-09 DIAGNOSIS — Z1239 Encounter for other screening for malignant neoplasm of breast: Secondary | ICD-10-CM

## 2024-07-09 MED ORDER — DENOSUMAB 60 MG/ML ~~LOC~~ SOSY
60.0000 mg | PREFILLED_SYRINGE | Freq: Once | SUBCUTANEOUS | Status: AC
  Administered 2024-07-09: 60 mg via SUBCUTANEOUS

## 2024-07-09 MED ORDER — DIAZEPAM 5 MG PO TABS: 5.0000 mg | ORAL_TABLET | Freq: Once | ORAL | 0 refills | Status: AC

## 2024-07-09 MED ORDER — DENOSUMAB 60 MG/ML ~~LOC~~ SOSY: 60.0000 mg | PREFILLED_SYRINGE | SUBCUTANEOUS | Status: AC

## 2024-07-09 NOTE — Patient Instructions (Signed)
 r

## 2024-07-09 NOTE — Progress Notes (Signed)
 Established Patient Office Visit  Subjective   Patient ID: Angela Daniels, female    DOB: March 25, 1946  Age: 78 y.o. MRN: [id number]  Chief Complaint  Patient presents with   Medical Management of Chronic Issues    HPI  Discussed the use of AI scribe software for clinical note transcription with the patient, who gave verbal consent to proceed.  History of Present Illness   [name] is a 78 year old female with hypertension who presents for a follow-up to recheck her blood pressure.  Her home blood pressure readings have been more consistent, with only one low reading recently, but her last two appointments showed high readings. She attributes fluctuations to emotional stress and 'white coat hypertension'. She is on 5 mg of olmesartan , which she feels is balancing her blood pressure, though some morning readings are around 150 mmHg.  A pulmonology PA suggested dysautonomia based on her symptoms. She has been wearing compression stockings for three weeks, which have made a noticeable difference. Physical therapy has significantly helped her condition.  She has cholesterol issues with a total cholesterol of 200 mg/dL and LDL of 873 mg/dL. She previously stopped cholesterol medication due to side effects like fatigue, bone aches, and stiffness. Her coronary calcium  score is 12.4.  She experiences hand tremors, initially in the right hand and now in both, which are more noticeable when holding objects. Knitting seems to have reduced the tremors.  She had COVID-19 in September and inquires about the timing for her next COVID vaccine.      Current Outpatient Medications  Medication Instructions   albuterol  (VENTOLIN  HFA) 108 (90 Base) MCG/ACT inhaler 2 puffs, Inhalation, Every 6 hours PRN   cholecalciferol (VITAMIN D3) 25 MCG (1000 UNIT) tablet Daily   denosumab  (PROLIA ) 60 mg, Every 6 months   diazepam  (VALIUM ) 5 mg, Oral,  Once, Take 1 tablet 30-60 minutes before MRI.    hydrALAZINE  (APRESOLINE ) 10 mg, Oral, Every 8 hours PRN   meclizine  (ANTIVERT ) 12.5 mg, Oral, 3 times daily PRN   olmesartan  (BENICAR ) 5 mg, Oral, Daily    Patient Active Problem List   Diagnosis Date Noted   Renal cyst 03/02/2024   Ascending aortic aneurysm 07/20/2023   Primary hypertension 07/20/2023   Elevated blood pressure reading without diagnosis of hypertension 06/03/2023   Calculus of kidney in female patient 06/03/2023   Asymptomatic microscopic hematuria 06/03/2023   Osteoporosis      Review of Systems  All other systems reviewed and are negative.     Objective:     BP 120/84   Pulse 73   Temp (!) 97.5 F (36.4 C) (Oral)   Ht 5' 10 (1.778 m)   Wt 172 lb 1.6 oz (78.1 kg)   SpO2 98%   BMI 24.69 kg/m    Physical Exam Vitals reviewed.  Constitutional:      Appearance: Normal appearance. She is well-groomed and normal weight.  Cardiovascular:     Rate and Rhythm: Normal rate and regular rhythm.     Heart sounds: S1 normal and S2 normal.  Pulmonary:     Effort: Pulmonary effort is normal.     Breath sounds: Normal breath sounds and air entry.  Neurological:     Mental Status: She is alert and oriented to person, place, and time. Mental status is at baseline.     Gait: Gait is intact.  Psychiatric:        Mood and Affect: Mood and affect normal.  Speech: Speech normal.        Behavior: Behavior normal.      No results found for any visits on 07/09/24.    The 10-year ASCVD risk score (Arnett DK, et al., 2019) is: 20.9%    Assessment & Plan:  Anxiety -     diazePAM ; Take 1 tablet (5 mg total) by mouth once for 1 dose. Take 1 tablet 30-60 minutes before MRI.  Dispense: 1 tablet; Refill: 0  Colon cancer screening -     Ambulatory referral to Gastroenterology  Osteoporosis without current pathological fracture, unspecified osteoporosis type -     Denosumab  -     Denosumab   Primary hypertension   Assessment and Plan    Adult Wellness  Visit Routine wellness visit with discussion on overall health, lifestyle, and preventive care. - Scheduled colonoscopy as it has been ten years since the last one. - Discussed COVID vaccine timing, recommended 90 days post-infection.  Hypertension Blood pressure readings at home are more consistent and stable on 5 mg olmesartan . Occasional high readings possibly due to stress and emotional factors. Cardiologist referral for further evaluation of blood pressure fluctuations. - Continue olmesartan  5 mg daily. - Rechecked blood pressure in office. - Continue follow-up with cardiologist for further evaluation.  Dizziness and imbalance Possibly related to vestibular dysfunction. ENT recommended MRI and CT scan for further evaluation. Compression stockings have been beneficial. -  MRI and CT scan was ordered by ENT. - Continue wearing compression stockings.  Hearing loss Recent ENT evaluation showed improvement in hearing after ear cleaning. Balance issues possibly related to vestibular retraining.  Essential tremor Intermittent fine tremors in hands, especially during fine motor tasks. Likely essential tremor, not related to vestibular issues. No severe symptoms currently. - Continue to monitor tremor symptoms and consider neurologist referral if symptoms worsen.  Hyperlipidemia Cholesterol levels slightly elevated with total cholesterol at 200 mg/dL and LDL at 873 mg/dL. Previous statin use caused fatigue and stiffness. Coronary calcium  score indicates minimal plaquing, suggesting low risk for cardiovascular events. Decision to hold off on statin therapy due to minimal plaquing and current lifestyle. - Continue to monitor cholesterol levels and cardiovascular risk factors. - Will consider further testing if symptoms or risk factors change.  Osteoporosis Management with Prolia  injections ongoing. - Continue Prolia  injections as scheduled.      I personally spent a total of 30 minutes in  the care of the patient today including counseling and educating, documenting clinical information in the EHR, independently interpreting results, communicating results, and reviewing progress notes and imaging/ labs ordered by multiple specialists.   Return in about 6 months (around 01/06/2025) for HTN.    Heron CHRISTELLA Sharper, MD

## 2024-07-20 ENCOUNTER — Ambulatory Visit (HOSPITAL_BASED_OUTPATIENT_CLINIC_OR_DEPARTMENT_OTHER)

## 2024-07-23 ENCOUNTER — Ambulatory Visit (HOSPITAL_COMMUNITY)
Admission: RE | Admit: 2024-07-23 | Discharge: 2024-07-23 | Disposition: A | Source: Ambulatory Visit | Attending: Otolaryngology

## 2024-07-23 DIAGNOSIS — I9589 Other hypotension: Secondary | ICD-10-CM

## 2024-07-23 DIAGNOSIS — H906 Mixed conductive and sensorineural hearing loss, bilateral: Secondary | ICD-10-CM

## 2024-07-23 DIAGNOSIS — H903 Sensorineural hearing loss, bilateral: Secondary | ICD-10-CM

## 2024-07-23 DIAGNOSIS — R2689 Other abnormalities of gait and mobility: Secondary | ICD-10-CM

## 2024-07-23 DIAGNOSIS — R42 Dizziness and giddiness: Secondary | ICD-10-CM

## 2024-07-23 MED ORDER — GADOBUTROL 1 MMOL/ML IV SOLN
7.0000 mL | Freq: Once | INTRAVENOUS | Status: AC | PRN
  Administered 2024-07-23: 7 mL via INTRAVENOUS

## 2024-07-24 ENCOUNTER — Ambulatory Visit (HOSPITAL_COMMUNITY)
Admission: RE | Admit: 2024-07-24 | Discharge: 2024-07-24 | Disposition: A | Source: Ambulatory Visit | Attending: Otolaryngology

## 2024-07-24 ENCOUNTER — Encounter: Payer: Self-pay | Admitting: Audiology

## 2024-07-24 DIAGNOSIS — H903 Sensorineural hearing loss, bilateral: Secondary | ICD-10-CM

## 2024-07-24 DIAGNOSIS — R42 Dizziness and giddiness: Secondary | ICD-10-CM

## 2024-07-24 DIAGNOSIS — I9589 Other hypotension: Secondary | ICD-10-CM

## 2024-07-24 DIAGNOSIS — R2689 Other abnormalities of gait and mobility: Secondary | ICD-10-CM

## 2024-07-24 DIAGNOSIS — H906 Mixed conductive and sensorineural hearing loss, bilateral: Secondary | ICD-10-CM

## 2024-07-25 ENCOUNTER — Encounter (HOSPITAL_COMMUNITY): Payer: Self-pay

## 2024-07-25 ENCOUNTER — Ambulatory Visit (HOSPITAL_COMMUNITY)
Admission: RE | Admit: 2024-07-25 | Discharge: 2024-07-25 | Disposition: A | Discharge status: Home / Self Care | Source: Ambulatory Visit | Attending: Student | Admitting: Student

## 2024-07-25 ENCOUNTER — Ambulatory Visit (HOSPITAL_BASED_OUTPATIENT_CLINIC_OR_DEPARTMENT_OTHER)
Admission: RE | Admit: 2024-07-25 | Discharge: 2024-07-25 | Disposition: A | Discharge status: Home / Self Care | Source: Ambulatory Visit | Attending: Student | Admitting: Student

## 2024-07-25 DIAGNOSIS — R200 Anesthesia of skin: Secondary | ICD-10-CM | POA: Diagnosis not present

## 2024-07-25 LAB — VAS US ABI WITH/WO TBI
Left ABI: 1.4
Right ABI: 1.23

## 2024-07-26 ENCOUNTER — Telehealth: Payer: Self-pay

## 2024-07-26 NOTE — Telephone Encounter (Unsigned)
 Copied from CRM [id number]. Topic: Clinical - Medication Question >> Jun 11, 2024 10:22 AM Avram MATSU wrote: Reason for CRM: patient would like to know if the medication apixaban  (ELIQUIS ) 5 MG TABS tablet [[id number]] is needed for her health. To her knowledge this was suppose to be temporary. She was informed this medication is not needed. [phone] (M)

## 2024-07-28 ENCOUNTER — Ambulatory Visit: Payer: Self-pay | Admitting: Student

## 2024-07-30 NOTE — Telephone Encounter (Signed)
 Patient does not have eliquis  listed in her medication list, and I do not see a reason to put her on it so no she does not need to be on this medication.

## 2024-07-31 ENCOUNTER — Telehealth (INDEPENDENT_AMBULATORY_CARE_PROVIDER_SITE_OTHER): Payer: Self-pay

## 2024-07-31 NOTE — Telephone Encounter (Signed)
 Patient informed of the message below.

## 2024-07-31 NOTE — Telephone Encounter (Signed)
 Left a message for the patient to return my call.

## 2024-07-31 NOTE — Telephone Encounter (Signed)
 07/31/2024 12:32 PM Patient called wanting to go over MRI results.

## 2024-08-20 ENCOUNTER — Ambulatory Visit (INDEPENDENT_AMBULATORY_CARE_PROVIDER_SITE_OTHER): Admitting: Otolaryngology

## 2024-08-20 ENCOUNTER — Encounter (INDEPENDENT_AMBULATORY_CARE_PROVIDER_SITE_OTHER): Payer: Self-pay | Admitting: Otolaryngology

## 2024-08-20 DIAGNOSIS — R42 Dizziness and giddiness: Secondary | ICD-10-CM

## 2024-08-20 DIAGNOSIS — R2689 Other abnormalities of gait and mobility: Secondary | ICD-10-CM | POA: Diagnosis not present

## 2024-08-20 DIAGNOSIS — H906 Mixed conductive and sensorineural hearing loss, bilateral: Secondary | ICD-10-CM | POA: Diagnosis not present

## 2024-08-20 DIAGNOSIS — H903 Sensorineural hearing loss, bilateral: Secondary | ICD-10-CM

## 2024-08-20 DIAGNOSIS — G039 Meningitis, unspecified: Secondary | ICD-10-CM

## 2024-08-20 NOTE — Progress Notes (Signed)
 Dear Dr. Ozell, Here is my assessment for our mutual patient, Angela Daniels. Thank you for allowing me the opportunity to care for your patient. Please do not hesitate to contact me should you have any other questions. Sincerely, Dr. Eldora Blanch  Otolaryngology Clinic Note Referring provider: Dr. Ozell HPI:  Initial visit (06/2024): Discussed the use of AI scribe software for clinical note transcription with the patient, who gave verbal consent to proceed.  History of Present Illness Angela Daniels is a 79 year old female with orthostatic hypotension and tinnitus who presents with dizziness and balance issues.  She experiences dizziness and balance issues without vertigo, with episodes lasting for hours. Symptoms worsen in environments like grocery stores or low-light areas. Vestibular therapy over the past two months has provided some improvement. Some near syncopal episodes. She experiences tinnitus and sometimes a sensation of fullness which is bilateral during dizziness episodes but no N/V. She is sensitive to low light environments, which exacerbate her symptoms. No facial weakness, numbness. No headache.   Patient denies: ear pain, drainage; does have chronic bilateral non-pulsatile tinnitus and bilateral hearing loss Patient additionally denies: deep pain in ear canal, eustachian tube symptoms such as popping, crackling, sensitive to pressure changes Patient also denies barotrauma, vestibular suppressant use, ototoxic medication use Prior ear surgery: no No h/o frequent ear infections. Not on any nasal medications  --------------------------------------------------------- 08/20/2024 The patient gave consent to have this visit done by telemedicine / virtual visit, two identifiers were used to identify patient. This is also consent for access the chart and treat the patient via this visit. The patient is located in [place] .  I, the provider, am at the office.  We spent 15  minutes together for the visit.  Joined by telephone  We discussed her CT and MRI. She continues to have some intermittent imbalance and dizziness but no frank vertigo. Patient denies: ear pain, fullness, drainage Maybe new tremor -- intention? No headaches or diplopia  ENT Surgery: Tonsillectomy Personal or FHx of bleeding dz or anesthesia difficulty: no  AP/AC: Eliquis   Tobacco: no  PMHx CAD, SVT, Venous Thrombosis RLE, HTN with orthostatic hypotension, HLD, Arthritis. Bronchiectasis, Venous insufficiency  Independent Review of Additional Tests or Records:  Aline Door (05/18/2024): noted concern for CP with dizziness and near syncope. Dizziness sounded vasovagal or orthostatic. Keeping track of her BP, some soft BP and ultimately stopped anti-HTN; noted BP does drop with standing. Working with PT with improvement. Noted atypical chest pain. Dx: Orhtostatic hypotension; Rx: decrease olmesartan ; did not think dizziness is cardiac in nature - suspect vestibular with some orthostatic hypotension Neurology Dr. Gregg 03/15/2024: dizziness, near syncope, brain fog, sx improving; Does not seem like sx are neurological in nature.  CBC and BMP  (04/05/2024, 03/2024): WBC 5.1, BUN/Cr 9/0.69 CTH 02/22/2024 independently interpreted with respect to ears: b/l cerumen impaction; b/l mastoids and ME well aerated, b/l otic capsules without pathology or ossicular chain but cuts thick so suboptimal 06/2024 Audiogram was independently reviewed and interpreted by me and it reveals - A/A tymps; WRT AD/AS 92/96% AD/AS; noted mixed hearing loss low frequency predominantly with asymmetric thresholds high frequency; carhart AD?   SNHL= Sensorineural hearing loss  MRI Brain 07/23/2024 interpreted with respect to ears, agree with read: no obvious retrocochlear lesion  IMPRESSION: There is no internal auditory canal tumor identified. The vestibular and cochlear structures are normal and symmetric. There is no  fluid or abnormal tissue seen in the middle ear on either side.  There  is mild diffuse dural enhancement. The cause of this is uncertain. The differential includes intracranial hypotension. There is no imaging evidence of sagging of the brain to suggest that diagnosis. Correlate with other clinical information as to whether intracranial hypotension is possible.   Other conditions which commonly cause this finding are bone marrow metastases and prior craniotomy, neither of which are present.   When no other etiology is found, this is commonly referred to as idiopathic hypertrophic pachymeningitis, which can be associated with hearing loss  CT Temporal bones independently interpreted with respect to ears:  IMPRESSION: Normal. No evidence of otosclerosis or other significant abnormality. PMH/Meds/All/SocHx/FamHx/ROS:   Past Medical History:  Diagnosis Date   Allergy     Arthritis    Hypertension    07/20/23   Osteoporosis    Positive TB test    Thoracic aortic aneurysm      Past Surgical History:  Procedure Laterality Date   TONSILLECTOMY     1954    Family History  Problem Relation Age of Onset   Hyperlipidemia Mother    Hypertension Mother    Hearing loss Mother    Kidney disease Father    Cancer Father        Bladder cancer   Hypertension Sister    Hypertension Brother    Cancer Brother      Social Connections: Unknown (07/05/2024)   Social Connection and Isolation Panel    Frequency of Communication with Friends and Family: More than three times a week    Frequency of Social Gatherings with Friends and Family: More than three times a week    Attends Religious Services: Not on file    Active Member of Clubs or Organizations: Yes    Attends Banker Meetings: More than 4 times per year    Marital Status: Widowed      Current Outpatient Medications:    albuterol  (VENTOLIN  HFA) 108 (90 Base) MCG/ACT inhaler, Inhale 2 puffs into the lungs every  6 (six) hours as needed for wheezing or shortness of breath., Disp: 8 g, Rfl: 6   cholecalciferol (VITAMIN D3) 25 MCG (1000 UNIT) tablet, Take by mouth daily., Disp: , Rfl:    denosumab  (PROLIA ) 60 MG/ML SOSY injection, Inject 60 mg into the skin every 6 (six) months., Disp: , Rfl:    hydrALAZINE  (APRESOLINE ) 10 MG tablet, Take 1 tablet (10 mg total) by mouth every 8 (eight) hours as needed (light headedness, dizziness with a bp above 160/100)., Disp: , Rfl:    meclizine  (ANTIVERT ) 12.5 MG tablet, Take 1 tablet (12.5 mg total) by mouth 3 (three) times daily as needed for dizziness., Disp: 30 tablet, Rfl: 0   olmesartan  (BENICAR ) 5 MG tablet, Take 1 tablet by mouth once daily, Disp: 90 tablet, Rfl: 3  Current Facility-Administered Medications:    denosumab  (PROLIA ) injection 60 mg, 60 mg, Subcutaneous, Q6 months, Ozell Heron HERO, MD   [START ON 01/07/2025] denosumab  (PROLIA ) injection 60 mg, 60 mg, Subcutaneous, Q6 months, Ozell Heron HERO, MD   Physical Exam:   There were no vitals taken for this visit.  Salient findings:  Unable to perform due to phone viist  Seprately Identifiable Procedures:  Prior to initiating any procedures, risks/benefits/alternatives were explained to the patient and verbal consent obtained. None today  Impression & Plans:  Ardell Aaronson is a 79 y.o. female with:  1. Dizziness   2. Mixed conductive and sensorineural hearing loss of both ears   3. Imbalance   4.  Asymmetrical sensorineural hearing loss   5. Idiopathic hypertrophic pachymeningitis    Dizziness/Imbalance with b/l mixed hearing loss - Balance disturbance with dizziness, not associated with vertigo. Possible causes include orthostatic hypotension and vestibular migraine. Neurological evaluation not suspecting central cause. Vestibular rehabilitation showed improvement.  CT temporal bones reassuring overall but MRI showing no retrocochlear lesions but is showing idiopathic hypertrophic  pachymeningitis --- we discussed that this can be related to HL and potential treatment can be high dose steroids for this; dizziness with this is controversial  As such, we had a long conversation and discussed options --- formal vestib testing, vestibular rehab and neuro referral; will defer steroids currently in case Neuro wishes for rheum referral/further workup --- will defer any Rheum workup or decision to send to Rheum to Neuro given central finding - She is interested in formal vestib testing which we will order - Continue vestibular rehabilitation therapy. - f/u 3 months with me by phone, sooner as necessary; can consider high dose steroids - She is planning to contact her neurologist  See below regarding exact medications prescribed this encounter including dosages and route: No orders of the defined types were placed in this encounter.     Thank you for allowing me the opportunity to care for your patient. Please do not hesitate to contact me should you have any other questions.  Sincerely, Eldora Blanch, MD Otolaryngologist (ENT), [facility] Phone: [phone] Fax: [phone]  08/20/2024, 4:49 PM   MDM:  low

## 2024-08-21 ENCOUNTER — Encounter: Payer: Self-pay | Admitting: Neurology

## 2024-08-21 ENCOUNTER — Telehealth (INDEPENDENT_AMBULATORY_CARE_PROVIDER_SITE_OTHER): Payer: Self-pay | Admitting: Otolaryngology

## 2024-08-21 NOTE — Telephone Encounter (Signed)
 Called patient to schedule follow-up phone visit.  Patient is concerned that she cannot get into her Neurologist until August.  Was wondering what needs to be or can be done and what are the next steps available.  Please advise.

## 2024-08-22 ENCOUNTER — Telehealth (INDEPENDENT_AMBULATORY_CARE_PROVIDER_SITE_OTHER): Payer: Self-pay

## 2024-08-22 ENCOUNTER — Other Ambulatory Visit: Payer: Self-pay | Admitting: Neurology

## 2024-08-22 ENCOUNTER — Encounter (INDEPENDENT_AMBULATORY_CARE_PROVIDER_SITE_OTHER): Payer: Self-pay

## 2024-08-22 DIAGNOSIS — G96819 Other intracranial hypotension: Secondary | ICD-10-CM

## 2024-08-22 NOTE — Telephone Encounter (Signed)
 The referral to [facility] they stated can not go through fax that we need to call them at [phone] and speak to the referral department to get the referral through.

## 2024-08-23 ENCOUNTER — Telehealth: Payer: Self-pay | Admitting: Neurology

## 2024-08-23 NOTE — Telephone Encounter (Signed)
 I called pt.  She had several questions. 1  What is definitive diagnosis  : head leak or spine? 2 she has allergy  to iodine,  change test?  , is the radiologist experienced doing the blood patch.    I relayed she can call [facility] and asking about contrast used if option other then iodine.  I relayed that blood patch is done by radiologist and they do them all the time for our other LP patients.  I offered VV, when would be good time?  May respond mychart too.

## 2024-08-23 NOTE — Telephone Encounter (Signed)
 Pt is asking for a call from RN to discuss the LP's ordered by Dr Gregg, please call to discuss.

## 2024-08-27 ENCOUNTER — Telehealth: Payer: Self-pay

## 2024-08-27 NOTE — Telephone Encounter (Signed)
 Patient called regarding having a second opinion appointment with Dr. Lester in regards to her recent imaging and diagnosis.   Dr. Lester reviewed the imaging she had on 07/23/2024 and 07/24/2024 as the reviewing Neuroradiologist.  Patient wants a second opinion from Neurology recommendation. She would like to have an appointment with him to do so and also wants to know  if Dr. Lester would perform the CT MYELOGRAM if that is what he also suggests.   She states that she does not feel that [facility] have the expertise to give her the best care and that Dr. Wendall credentials and reviews led her to us .  I told her I wanted to talk with Dr. Lester regarding this request to see if we could help her.

## 2024-08-27 NOTE — Telephone Encounter (Signed)
 We don't have a definitive diagnosis and that is why we are performing the test. We think possibly a leak in the spine.  When radiology department call to schedule her she can ask specific questions regarding the procedure.   Dr. Kentrell Guettler

## 2024-08-29 NOTE — Telephone Encounter (Signed)
 Dr. Lester is agreeable to seeing the patient.

## 2024-08-29 NOTE — Telephone Encounter (Signed)
 Pt called stating that GI informed her that they are waiting on clarification on the orders that were put in. She also would like the nurse to call her so that she can get more information from the RN. Please advise.

## 2024-08-30 ENCOUNTER — Other Ambulatory Visit: Payer: Self-pay | Admitting: Neurology

## 2024-08-30 ENCOUNTER — Ambulatory Visit: Admitting: Internal Medicine

## 2024-08-30 ENCOUNTER — Encounter: Payer: Self-pay | Admitting: Internal Medicine

## 2024-08-30 VITALS — BP 124/66 | HR 73 | Temp 97.9°F | Ht 70.0 in | Wt 173.4 lb

## 2024-08-30 DIAGNOSIS — R0609 Other forms of dyspnea: Secondary | ICD-10-CM | POA: Diagnosis not present

## 2024-08-30 DIAGNOSIS — R7689 Other specified abnormal immunological findings in serum: Secondary | ICD-10-CM | POA: Diagnosis not present

## 2024-08-30 DIAGNOSIS — J479 Bronchiectasis, uncomplicated: Secondary | ICD-10-CM

## 2024-08-30 DIAGNOSIS — R42 Dizziness and giddiness: Secondary | ICD-10-CM

## 2024-08-30 MED ORDER — PREDNISONE 50 MG PO TABS: ORAL_TABLET | ORAL | 0 refills | Status: DC

## 2024-08-30 NOTE — Progress Notes (Signed)
 "      OV 04/05/2024  Subjective:  Patient ID: Angela Daniels, female , DOB: Dec 22, 1945 , age 79 y.o. , MRN: [id number] , ADDRESS: [address] [place] [address] PCP Angela Heron HERO, MD Patient Care Team: Angela Heron HERO, MD as PCP - General (Family Medicine) Angela Lonni BIRCH, MD as PCP - Cardiology (Cardiology)  This Provider for this visit: Treatment Team:  Attending Provider: Mannam, Praveen, MD    04/05/2024 -   Chief Complaint  Patient presents with   Consult    Referred by Dr. Heron Angela for eval of bronchiectasis. She c/o DOE over the past year. She states she is unable to do anything like she used to- playing tennis, gardening, exercise. She has dizziness and feels off balance.      HPI Angela Daniels 79 y.o. -referred by primary care physician for evaluation of bronchiectasis bilateral mid zone incidental findings.  79 year old lady who is originally from [place]  but then retired to [place]  with her husband who worked in [employer].  He ultimately passed away in 07/06/24after suffering from an aggressive form of prostate cancer.  She says up until 2 summers ago she was actively playing tennis but after her husband's illness the tennis went down but she was still active and kept herself physically fit.  After her husband passed away she relocated to [facility]  in September 2024 to be with her son and grandson.  Here she is continue to be active and fit but more recently has gotten even more active getting back into tennis [she plays at the [employer] level 3.5].  She is also been doing gardening.  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  But otherwise quite healthy.  She was originally born in [place] the country.  That she got exposed to her sister who had tuberculosis.  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  Review of the records indicate that in March 2025 she did have echocardiogram she also reported ZIO monitoring all of this was normal. Against this backdrop, on February 14, 2024 she played tennis and felt great.  The next day also did some gardening and then abruptly on February 16, 2024 she had a near syncopal episode and nearly fell but actually did not fall.  Her blood pressure was okay but since then she started having progressive symptoms of feeling poorly.  She was then referred to the ER where she had CT scan of the chest head all of which was normal except for bilateral mid zone bronchiectasis [personally visualized and confirmed] but she has never had cough she still does not have cough.  CT angiogram chest was negative in the ED for pulm embolism.  However she is continue to have new onset shortness of breath since February 16, 2024 along with dizziness.  She is also reporting rib pressure.  The symptoms are progressive.  Dyspnea is class III.  She is unable to play tennis.  She is also having balance issues.  Sit/stand test here is normal except she got tachycardic.    CT Chest data from date: February 22 2024  - personally visualized and independently interpreted : yes - my findings are: as below  Mediastinum/Nodes: No enlarged mediastinal, hilar, or axillary lymph nodes. Thyroid  gland, trachea, and esophagus demonstrate no significant findings. There is a small  hiatal hernia.   Lungs/Pleura: In the anteromedial base of the lingula and right middle lobe there is chronic scarring change, bronchiectasis and bronchial impactions consistent with a chronic inflammatory/infectious process such as MAC.   There are linear scar-like opacities in the lower lobes, outer right middle lobe.   There is biapical reticulated scarring and scattered calcifications. There is no acute consolidation or pleural effusion. Central airways are clear.   Mild elevation again noted right hemidiaphragm the with  overlying hazy atelectasis.   There is a calcified granuloma medially in the right lower lobe. No noncalcified nodules are seen.  05/28/2024   Referring provider: Geronimo Amel, MD  HPI: Angela Daniels is a 79 y/o female with PMH of HTN and bronchiectasis who presents today for follow up.  She was seen in our office on 04/05/2024 for initial consultation after an incidental finding of bilateral mid zone bronchiectasis on chest CT.  Her main complaints have been fatigue, dyspnea on exertion, lower chest tightness, and difficulty getting her ADLs done associated with dizziness and lightheadedness.  She has been seen by neurology, cardiology, psychiatry, rheumatology, and pulmonology for evaluation of this.  Thus far her workup has been negative with the exception of a positive ANA, echo with dilated right atrium, and ongoing hypertension alternating with hypotension.  Notably, she did end up with a superficial blood clot in her lower extremity at the end of July 2025 and is now on Eliquis .  She reports that her initial complaint of dyspnea on exertion has resolved.  She is now back to walking on trails and is not having shortness of breath.  She continues to struggle with fatigue and dizziness.  She is following actively with cardiology for this.  She did have a recent increase in her blood pressure medication that she notes. Today she completed her pulmonary function testing.  We reviewed these today.  FVC of 93%, FEV1 90%, FEV1 to FVC ratio of 98%.  Total lung capacity 96%.  Overall spirometry and lung volumes are noted to be normal.  DLCO also normal.  Full read pending. She denies fever, chills, cough, chest pain, weight changes, vision changes, heat or cold intolerance, or night sweats.  OV 08/30/2024  Subjective:  Patient ID: Angela Daniels, female , DOB: [birth year] , age 79 y.o. , MRN: [id number] , ADDRESS: [address] [place] [address] PCP Angela Heron HERO, MD Patient Care  Team: Angela Heron HERO, MD as PCP - General (Family Medicine) Angela Lonni BIRCH, MD as PCP - Cardiology (Cardiology)  This Provider for this visit: Treatment Team:  Attending Provider: Geronimo Amel, MD    08/30/2024 -   Chief Complaint  Patient presents with   Bronchitis    Pt states since LOV breathing has been good, pt stated she has noticed a couple of times while on treadmill she was having more labored breathing with congestion, pt stating bending down she becomes SOB a bit more, but doesn't happen often       HPI Angela Daniels 79 y.o. - Ylonda Storr is a 79 year old female who presents for evaluation  annd followup with incidental findings of lingula collapse/middle lobe syndrome slight bronchiectasis.  And shortness of breath associated with that.  In the interim she saw a nurse practitioner and pulmonary function test was normal allergy  test is normal but ANA was slightly positive at 1: 160.   She occasionally experiences shortness of breath, particularly during exertion, such as when using a treadmill. On  January 10th, she noted congestion and difficulty breathing after 40 minutes of treadmill exercise. No wheezing, nighttime coughing, or hemoptysis.   But in the interim she has got other problems of a suspected cerebrospinal fluid leak. She was referred by Dr. Tobie for evaluation of a suspected cerebrospinal fluid leak. She has been experiencing dizziness and balance issues, for which she has been receiving physical therapy. Her physical therapist noted significant improvement and cleared her for activities such as tennis about a month ago. However, she has been undergoing further evaluation due to persistent symptoms. She recently underwent a contrast CT brain scan after an audiological exam revealed an imbalance between her left and right readings. She was told by her neurologist that the findings could be due to a cerebrospinal fluid leak in the spine, and that  there was a consideration of pachymeningitis. A CT-guided myelogram is scheduled to investigate this further.   Her ANA test was slightly positive, raising the possibility of an autoimmune component. She has also noticed worsening hearing and head pressure. Additionally, she has developed a slight hand tremor recentl.  She wants a referral to rheumatology.        SIT STAND TEST - goal 15 times   04/05/2024    O2 used ra   PRobe - finter or forehead finger   Number sit and stand completed - goal 15 15   Time taken to complete 52 sec   Resting Pulse Ox/HR/Dyspnea  100% and 85/min and dyspnea of 1/10    Peak measures 99 % and 113/min and dyspnea of 1/10   Final Pulse Ox/HR 99% and 102/min and dyspnea of 1/10   Desaturated </= 88% no   Desaturated <= 3% points no   Got Tachycardic >/= 90/min yes   Miscellaneous comments Minimal dyspnea     CT Chest data from date: *July 2025  - personally visualized and independently interpreted : yes - my findings are: As described in the official report.  Latest Reference Range & Units 02/26/20 16:26 10/13/23 19:04 02/22/24 12:11 02/22/24 17:23 04/05/24 15:01  Hemoglobin 12.0 - 15.0 g/dL 85.8 85.4 85.2 85.9 85.6    Latest Reference Range & Units 02/22/24 12:11 04/05/24 15:01  Eosinophils Absolute 0.0 - 0.7 K/uL 0.0 0.0   PFT     Latest Ref Rng & Units 05/28/2024   12:34 PM  PFT Results  FVC-Pre L 3.31   FVC-Predicted Pre % 93   FVC-Post L 3.43   FVC-Predicted Post % 96   Pre FEV1/FVC % % 73   Post FEV1/FCV % % 76   FEV1-Pre L 2.43   FEV1-Predicted Pre % 90   FEV1-Post L 2.60   DLCO uncorrected ml/min/mmHg 21.73   DLCO UNC% % 93   DLCO corrected ml/min/mmHg 21.17   DLCO COR %Predicted % 91   DLVA Predicted % 115   TLC L 5.76   TLC % Predicted % 96   RV % Predicted % 98        LAB RESULTS last 96 hours No results found.       has a past medical history of Allergy , Arthritis, Hypertension, Osteoporosis, Positive TB  test, and Thoracic aortic aneurysm.   reports that she has never smoked. She has never used smokeless tobacco.  Past Surgical History:  Procedure Laterality Date   TONSILLECTOMY     1954    Allergies[1]  Immunization History  Administered Date(s) Administered   Fluad Quad(high Dose 65+) 06/04/2021   Fluzone Influenza virus  vaccine,trivalent (IIV3), split virus 06/12/2014   INFLUENZA, HIGH DOSE SEASONAL PF 06/27/2015, 05/24/2016, 06/15/2017, 05/06/2018, 04/17/2019   Influenza,inj,Quad PF,6+ Mos 04/27/2019   Influenza,inj,quad, With Preservative 05/06/2018   Influenza-Unspecified 05/06/2018, 04/17/2019, 05/23/2020, 05/30/2023   Moderna Sars-Covid-2 Vaccination 09/07/2019, 09/08/2019, 10/01/2019, 10/06/2019, 10/29/2019   Pneumococcal Conjugate-13 04/23/2014   Pneumococcal Polysaccharide-23 08/27/2015   Tdap 08/16/2013, 06/12/2014   Zoster Recombinant(Shingrix) 10/29/2021   Zoster, Live 06/26/2014    Family History  Problem Relation Age of Onset   Hyperlipidemia Mother    Hypertension Mother    Hearing loss Mother    Kidney disease Father    Cancer Father        Bladder cancer   Hypertension Sister    Hypertension Brother    Cancer Brother     Current Medications[2]      Objective:   Vitals:   08/30/24 0938  BP: 124/66  Pulse: 73  Temp: 97.9 F (36.6 C)  TempSrc: Oral  SpO2: 98%  Weight: 173 lb 6.4 Daniels (78.7 kg)  Height: 5' 10 (1.778 m)    Estimated body mass index is 24.88 kg/m as calculated from the following:   Height as of this encounter: 5' 10 (1.778 m).   Weight as of this encounter: 173 lb 6.4 Daniels (78.7 kg).  @WEIGHTCHANGE @  [employer]   08/30/24 0938  Weight: 173 lb 6.4 Daniels (78.7 kg)     Physical Exam   General: No distress. Looks well O2 at rest: no Cane present: no Sitting in wheel chair: no Frail: no Obese: no Neuro: Alert and Oriented x 3. GCS 15. Speech normal Psych: Pleasant Resp:  Barrel Chest - no.  Wheeze - no, Crackles  - no, No overt respiratory distress CVS: Normal heart sounds. Murmurs - no Ext: Stigmata of Connective Tissue Disease - no HEENT: Normal upper airway. PEERL +. No post nasal drip        Assessment/     Assessment & Plan Bronchiectasis without complication (HCC)  DOE (dyspnea on exertion)  Dizziness  ANA positive    PLAN Patient Instructions     ICD-10-CM   1. DOE (dyspnea on exertion)  R06.09 Ambulatory referral to Rheumatology    Cardiopulmonary exercise test    2. Bronchiectasis without complication (HCC)  J47.9 Ambulatory referral to Rheumatology    Cardiopulmonary exercise test    3. Dizziness  R42     4. ANA positive  R76.89 Ambulatory referral to Rheumatology    Cardiopulmonary exercise test       PLAN Orders Placed This Encounter  Procedures   Cardiopulmonary exercise test - ASAP   CT Chest High Resolution - SUMMER 2026   Ambulatory referral to Rheumatology - FIRST AVAILABLE    Followup Return to see Dr Angela after the Stress test< 3 months    FOLLOWUP    Return for Return to see Dr Angela after the Stress test< 3 months.   ( Level 05 visit E&M 2024: Estb >= 40 min  in  visit type: on-site physical face to visit  in total care time and counseling or/and coordination of care by this undersigned MD - Dr Dorethia Angela. This includes one or more of the following on this same day 08/30/2024: pre-charting, chart review, note writing, documentation discussion of test results, diagnostic or treatment recommendations, prognosis, risks and benefits of management options, instructions, education, compliance or risk-factor reduction. It excludes time spent by the CMA or office staff in the care of the patient. Actual time 40  min)    SIGNATURE    Dr. Dorethia Cave, M.D., F.C.C.P,  Pulmonary and Critical Care Medicine Staff Physician, [facility] Director - Interstitial Lung Disease  Program  Pulmonary Fibrosis [facility] at [facility] [place], [place], [address]  Pager: [phone], If no answer or between  15:00h - 7:00h: call [phone] Telephone: [phone]  10:04 AM 08/30/2024     [1]  Allergies Allergen Reactions   Shellfish Allergy  Shortness Of Breath   Iodine     Severe burning sensation   Latex     Cannot tolerate for long periods of time.   Sulfa Antibiotics   [2]  Current Outpatient Medications:    cholecalciferol (VITAMIN D3) 25 MCG (1000 UNIT) tablet, Take by mouth daily., Disp: , Rfl:    denosumab  (PROLIA ) 60 MG/ML SOSY injection, Inject 60 mg into the skin every 6 (six) months., Disp: , Rfl:    hydrALAZINE  (APRESOLINE ) 10 MG tablet, Take 1 tablet (10 mg total) by mouth every 8 (eight) hours as needed (light headedness, dizziness with a bp above 160/100)., Disp: , Rfl:    meclizine  (ANTIVERT ) 12.5 MG tablet, Take 1 tablet (12.5 mg total) by mouth 3 (three) times daily as needed for dizziness., Disp: 30 tablet, Rfl: 0   olmesartan  (BENICAR ) 5 MG tablet, Take 1 tablet by mouth once daily, Disp: 90 tablet, Rfl: 3   albuterol  (VENTOLIN  HFA) 108 (90 Base) MCG/ACT inhaler, Inhale 2 puffs into the lungs every 6 (six) hours as needed for wheezing or shortness of breath. (Patient not taking: Reported on 08/30/2024), Disp: 8 g, Rfl: 6  Current Facility-Administered Medications:    denosumab  (PROLIA ) injection 60 mg, 60 mg, Subcutaneous, Q6 months, Angela Heron HERO, MD   [START ON 01/07/2025] denosumab  (PROLIA ) injection 60 mg, 60 mg, Subcutaneous, Q6 months, Angela Heron HERO, MD  "

## 2024-08-30 NOTE — Telephone Encounter (Signed)
 I called [employer],  spoke to [place] with scheduling.  She said that it is one day for the LP/imaging and if needed blood patch they will do same time.  Ask the radiologist when in about the bladder bx question.  (Healing time for blood patch and bladder bx which is 1 week later).  I relayed to pt in mychart.

## 2024-08-30 NOTE — Patient Instructions (Addendum)
"    ICD-10-CM   1. DOE (dyspnea on exertion)  R06.09 Ambulatory referral to Rheumatology    Cardiopulmonary exercise test    2. Bronchiectasis without complication (HCC)  J47.9 Ambulatory referral to Rheumatology    Cardiopulmonary exercise test    3. Dizziness  R42     4. ANA positive  R76.89 Ambulatory referral to Rheumatology    Cardiopulmonary exercise test       PLAN Orders Placed This Encounter  Procedures   Cardiopulmonary exercise test - ASAP   CT Chest High Resolution - SUMMER 2026   Ambulatory referral to Rheumatology - FIRST AVAILABLE    Followup Return to see Dr Geronimo after the Stress test< 3 months "

## 2024-09-04 NOTE — Progress Notes (Signed)
 Pt has 13 hour prep provided by ordering physician. Pt states allergy  was 25 years ago but Dr ordering as precaution. Times 2030, 0230, 0830.

## 2024-09-04 NOTE — Discharge Instructions (Signed)

## 2024-09-05 ENCOUNTER — Ambulatory Visit
Admission: RE | Admit: 2024-09-05 | Discharge: 2024-09-05 | Disposition: A | Source: Ambulatory Visit | Attending: Neurology

## 2024-09-05 ENCOUNTER — Other Ambulatory Visit: Payer: Self-pay | Admitting: Neurology

## 2024-09-05 ENCOUNTER — Inpatient Hospital Stay
Admission: RE | Admit: 2024-09-05 | Discharge: 2024-09-05 | Disposition: A | Source: Ambulatory Visit | Attending: Neurology

## 2024-09-05 DIAGNOSIS — G96819 Other intracranial hypotension: Secondary | ICD-10-CM

## 2024-09-05 MED ORDER — IOPAMIDOL (ISOVUE-M 300) INJECTION 61%
1.0000 mL | Freq: Once | INTRAMUSCULAR | Status: AC | PRN
  Administered 2024-09-05: 1 mL via EPIDURAL

## 2024-09-05 MED ORDER — IOPAMIDOL (ISOVUE-M 300) INJECTION 61%
10.0000 mL | Freq: Once | INTRAMUSCULAR | Status: AC
  Administered 2024-09-05: 10 mL via INTRATHECAL

## 2024-09-06 ENCOUNTER — Encounter (HOSPITAL_COMMUNITY)

## 2024-09-06 ENCOUNTER — Ambulatory Visit: Payer: Self-pay

## 2024-09-06 ENCOUNTER — Telehealth: Payer: Self-pay | Admitting: Neurology

## 2024-09-06 ENCOUNTER — Ambulatory Visit: Payer: Self-pay | Admitting: Neurology

## 2024-09-06 NOTE — Telephone Encounter (Signed)
 FYI Only or Action Required?: FYI only for provider: Advised to call specialist.  Patient was last seen in primary care on 07/09/2024 by Ozell Heron HERO, MD.  Called Nurse Triage reporting Facial Swelling.  Symptoms began yesterday.  Interventions attempted: Other: premedicated with benadryl  and prednisone .  Symptoms are: unchanged.  Triage Disposition: Call Specialist Now (overriding See HCP Within 4 Hours (Or PCP Triage))  Patient/caregiver understands and will follow disposition?: Yes                             Message from Avram MATSU sent at 09/06/2024  2:10 PM EST  Reason for Triage: mild facial swelling, red and flush. patient did a mylogram test   Reason for Disposition  Face swelling began after taking a drug  Answer Assessment - Initial Assessment Questions Patient has known reaction to iodine and shellfish. Premedicated before procedure: Prednisone , benadryl . She tried calling the provider who did her procedure and she left a voicemail.  1. ONSET: When did the swelling start? (e.g., minutes, hours, days)     This morning.  2. LOCATION: What part of the face is swollen? (e.g., cheek, entire face, jaw joint area, under jaw)     Both cheeks.  3. SEVERITY: How swollen is it?     Mild/puffiness.  4. ITCHING: Is there any itching? If Yes, ask: How much?   (Scale 1-10; mild, moderate or severe)     No.  5. PAIN: Is the swelling painful to touch? If Yes, ask: How painful is it?   (Scale 0-10; mild, moderate or severe)     No.  6. FEVER: Do you have a fever? If Yes, ask: What is it, how was it measured, and when did it start?      No.  7. CAUSE: What do you think is causing the face swelling?     Myelogram with blood patch yesterday. Medication reaction.  IV contrast and topical betadine and lidocaine used.  8. NEW MEDICINES: Have there been any new medicines started recently?     Received new medications yesterday  during procedure.  9. RECURRENT SYMPTOM: Have you had face swelling before? If Yes, ask: When was the last time? What happened that time?     Flushing will occur occasionally with stress but not accompanied with swelling.  10. OTHER SYMPTOMS: Do you have any other symptoms? (e.g., leg swelling, toothache)       Facial flushing (red and warm) on both cheeks started last night. This AM worsening and swelling started. No difficulty breathing, tongue swelling, difficulty swallowing, fever, rash, vomiting.  Protocols used: Face Swelling-A-AH

## 2024-09-06 NOTE — Telephone Encounter (Signed)
 Sent the results to Dr Onita (work-in) to review since Dr Gregg is out of the office.

## 2024-09-06 NOTE — Telephone Encounter (Signed)
 Pt is asking for a call to discuss the procedure done: mylogram w/ blood patch.  Pt states she was told to f/u with Dr Gregg. Please advise

## 2024-09-07 NOTE — Telephone Encounter (Signed)
 Noted- ok to close.

## 2024-09-10 ENCOUNTER — Encounter: Payer: Self-pay | Admitting: Neurology

## 2024-09-10 NOTE — Telephone Encounter (Signed)
 Pt sent a mychart message and I sent this to Dr. Gregg to address.

## 2024-09-12 ENCOUNTER — Ambulatory Visit: Admitting: Neuroradiology

## 2024-09-12 NOTE — Telephone Encounter (Signed)
 I called the pt and offered this AM at 10:15 for appt. Pt unable to come. Has bladder biopsy today and expects 2 day recovery. Pt would like an appt next week instead. I told her we would be back in touch with an alternate time. She thanked me for the call.

## 2024-09-14 ENCOUNTER — Telehealth: Payer: Self-pay

## 2024-09-14 NOTE — Telephone Encounter (Signed)
 Copied from CRM #[id number]. Topic: Referral - Status >> Sep 13, 2024 10:08 AM Dedra NOVAK wrote: Reason for CRM: Dolly from [facility] received patient's referral. She needs a copy of patient's lab results that show the positive ANA faxed to [phone] or [phone].   [facility] can you please fax requested documents.  Thanks so much!

## 2024-09-17 ENCOUNTER — Ambulatory Visit: Admitting: Cardiovascular Disease

## 2024-09-17 ENCOUNTER — Encounter: Payer: Self-pay | Admitting: Cardiovascular Disease

## 2024-09-17 VITALS — BP 108/60 | HR 88 | Ht 70.0 in | Wt 174.2 lb

## 2024-09-17 DIAGNOSIS — I340 Nonrheumatic mitral (valve) insufficiency: Secondary | ICD-10-CM | POA: Diagnosis not present

## 2024-09-17 DIAGNOSIS — I471 Supraventricular tachycardia, unspecified: Secondary | ICD-10-CM

## 2024-09-17 DIAGNOSIS — I2510 Atherosclerotic heart disease of native coronary artery without angina pectoris: Secondary | ICD-10-CM | POA: Diagnosis not present

## 2024-09-17 NOTE — Telephone Encounter (Signed)
 We can add them to the cancellation.

## 2024-09-17 NOTE — Patient Instructions (Signed)
 Medication Instructions:  The current medical regimen is effective;  continue present plan and medications.  *If you need a refill on your cardiac medications before your next appointment, please call your pharmacy*  Follow-Up: At [facility], you and your health needs are our priority.  As part of our continuing mission to provide you with exceptional heart care, our providers are all part of one team.  This team includes your primary Cardiologist (physician) and Advanced Practice Providers or APPs (Physician Assistants and Nurse Practitioners) who all work together to provide you with the care you need, when you need it.  Your next appointment:   1 year(s)  Provider:   Lonni Cash, MD    We recommend signing up for the patient portal called MyChart.  Sign up information is provided on this After Visit Summary.  MyChart is used to connect with patients for Virtual Visits (Telemedicine).  Patients are able to view lab/test results, encounter notes, upcoming appointments, etc.  Non-urgent messages can be sent to your provider as well.   To learn more about what you can do with MyChart, go to [email or web address].

## 2024-09-18 NOTE — Telephone Encounter (Signed)
 Labs have been faxed. NFN

## 2024-09-18 NOTE — Telephone Encounter (Signed)
 Pt called back , she took appt that was offered   Appt Scheduled

## 2024-09-18 NOTE — Telephone Encounter (Signed)
 Spoke with pt and offered cancellation for tomorrow 2/4 at 10:15 am. Pt unable to make it due to other appts and also currently trying to get her driveway cleared of the ice/snow. Patient will remain on wait list. She thanked me for the call. Of note, she is seeing Dr Lester tomorrow w/ neurosurgery as another set of eyes. She is not intending to replace her care here.

## 2024-09-18 NOTE — Telephone Encounter (Signed)
 noted

## 2024-09-18 NOTE — Telephone Encounter (Signed)
 Thank you :)

## 2024-09-19 ENCOUNTER — Ambulatory Visit: Admitting: Neuroradiology

## 2024-09-19 ENCOUNTER — Ambulatory Visit: Admitting: Neurology

## 2024-09-19 ENCOUNTER — Encounter: Payer: Self-pay | Admitting: Neurology

## 2024-09-19 VITALS — BP 122/70 | HR 85 | Ht 70.0 in | Wt 173.5 lb

## 2024-09-19 DIAGNOSIS — R4189 Other symptoms and signs involving cognitive functions and awareness: Secondary | ICD-10-CM | POA: Diagnosis not present

## 2024-09-19 DIAGNOSIS — G96819 Other intracranial hypotension: Secondary | ICD-10-CM | POA: Diagnosis not present

## 2024-09-19 DIAGNOSIS — F419 Anxiety disorder, unspecified: Secondary | ICD-10-CM | POA: Diagnosis not present

## 2024-09-19 NOTE — Progress Notes (Signed)
 "   [facility]  PATIENT: Angela Daniels DOB: 02-09-1946  REQUESTING CLINICIAN: Ozell Heron HERO, MD HISTORY FROM: Patient  REASON FOR VISIT: Dizziness/Brain fog   HISTORICAL   CHIEF COMPLAINT:  Chief Complaint  Patient presents with   Follow-up    Room 12 Alone    INTERVAL HISTORY 09/19/2024 Angela Daniels is a 79 year old female with suspected cerebrospinal fluid leak, status post blood patch, presenting for follow-up of persistent neurological symptoms including head pressure, tremor, sensory changes, dizziness, and allergic reaction to contrast dye.  She continues to experience a persistent sensation of head pressure, described as a tightness or small vice around her head, without associated headache. The pressure has been nearly constant for several months. She is currently engaged in physical therapy and is scheduled for vestibular testing. Episodes of facial flushing occur, particularly after energetic conversations or during periods of stress. She is not currently taking statins, having discontinued rosuvastatin  due to side effects.  She describes a hand tremor that developed over the past few months, which had progressively worsened prior to her recent blood patch procedure. Since the blood patch, the tremor has slightly improved and the previously noted arm weakness has resolved. The tremor persists with variable severity, occasionally interfering with handwriting, and worsens with anxiety or stress. She can control the tremor with focus or pressure. There is no family history of tremor.  She reports new sensory symptoms over the past few days, including numbness and tingling in the feet, now extending up to below the knees. She experiences a transient prickly sensation from her feet to below her knees upon waking, which resolves after ambulation. She also notes a funny feeling in her feet after sitting, which improves with walking and is not constant.  Previous vascular evaluation for similar symptoms was unremarkable.  She continues to experience dizziness, lightheadedness, and balance difficulties, which were the initial reasons for neurology consultation. These symptoms have impacted her daily activities. She also has hearing loss and tinnitus, which do not significantly affect her function.  PRIOR CLINICAL COURSE She initially presented with dizziness, lightheadedness, and near-syncope, prompting neurology referral. She underwent evaluation by ENT, pulmonology, and vascular specialists, and had MRI and myelogram initiated by ENT. Spine MRI with contrast demonstrated a cyst around T8 and dural enhancement. Lumbar puncture revealed a low opening pressure of 5. She subsequently underwent an epidural blood patch.      HISTORY OF PRESENT ILLNESS:  This is a 79 year old woman with hypertension, hyperlipidemia, new superficial DVT, anxiety  who is presenting with complaints of near syncope, chest tightness and brain fog.  Patient reports episodes started back in January when she was experience chest tightness. She presented to the ED in February, her work up has been unrevealing. She reports at that time, she just lost he Husband and moved from [place]  to [facility] to be close to family.  She continues to experience dizziness, near syncope, brain fog even though she recognized that her symptoms are improving.  In July, she presented to ED for a near syncopal episode and her CT head did not show any acute abnormalities. She tells me that she is very active, plays tennis, her blood pressure at home are normal but she thinks that she has white coat syndrome . She also reports some jitteriness and tingling in her fingers. Her family has mentioned that her symptoms might be related to increase stress  She denies any new headaches, no changes in vision, no new weakness, numbness  and no falls.    OTHER MEDICAL CONDITIONS: Hypertension, Hyperlipidemia,  recent DVTAnxiety    REVIEW OF SYSTEMS: Full 14 system review of systems performed and negative with exception of: As noted in the HPI   ALLERGIES: Allergies  Allergen Reactions   Shellfish Allergy  Shortness Of Breath   Iodine     Severe burning sensation   Latex     Cannot tolerate for long periods of time.   Sulfa Antibiotics     HOME MEDICATIONS: Outpatient Medications Prior to Visit  Medication Sig Dispense Refill   albuterol  (VENTOLIN  HFA) 108 (90 Base) MCG/ACT inhaler Inhale 2 puffs into the lungs every 6 (six) hours as needed for wheezing or shortness of breath. 8 g 6   cholecalciferol (VITAMIN D3) 25 MCG (1000 UNIT) tablet Take by mouth daily.     denosumab  (PROLIA ) 60 MG/ML SOSY injection Inject 60 mg into the skin every 6 (six) months.     hydrALAZINE  (APRESOLINE ) 10 MG tablet Take 1 tablet (10 mg total) by mouth every 8 (eight) hours as needed (light headedness, dizziness with a bp above 160/100).     meclizine  (ANTIVERT ) 12.5 MG tablet Take 1 tablet (12.5 mg total) by mouth 3 (three) times daily as needed for dizziness. 30 tablet 0   olmesartan  (BENICAR ) 5 MG tablet Take 1 tablet by mouth once daily 90 tablet 3   Facility-Administered Medications Prior to Visit  Medication Dose Route Frequency Provider Last Rate Last Admin   denosumab  (PROLIA ) injection 60 mg  60 mg Subcutaneous Q6 months Ozell Heron HERO, MD       [START ON 01/07/2025] denosumab  (PROLIA ) injection 60 mg  60 mg Subcutaneous Q6 months Ozell Heron HERO, MD        PAST MEDICAL HISTORY: Past Medical History:  Diagnosis Date   Allergy     Arthritis    CAD (coronary artery disease)    Hypertension    07/20/23   Osteoporosis    Positive TB test    SVT (supraventricular tachycardia)    Thoracic aortic aneurysm     PAST SURGICAL HISTORY: Past Surgical History:  Procedure Laterality Date   TONSILLECTOMY     1954    FAMILY HISTORY: Family History  Problem Relation Age of Onset    Hyperlipidemia Mother    Hypertension Mother    Hearing loss Mother    Kidney disease Father    Cancer Father        Bladder cancer   Hypertension Sister    Hypertension Brother    Cancer Brother     SOCIAL HISTORY: Social History   Socioeconomic History   Marital status: Widowed    Spouse name: Not on file   Number of children: 2   Years of education: Not on file   Highest education level: Bachelor's degree (e.g., BA, AB, BS)  Occupational History   Occupation: Retired runner, broadcasting/film/video  Tobacco Use   Smoking status: Never   Smokeless tobacco: Never  Vaping Use   Vaping status: Never Used  Substance and Sexual Activity   Alcohol use: Yes    Comment: occasionally   Drug use: Never   Sexual activity: Not Currently  Other Topics Concern   Not on file  Social History Narrative   Not on file   Social Drivers of Health   Tobacco Use: Low Risk (09/19/2024)   Patient History    Smoking Tobacco Use: Never    Smokeless Tobacco Use: Never    Passive Exposure: Not on  file  Financial Resource Strain: Low Risk (07/05/2024)   Overall Financial Resource Strain (CARDIA)    Difficulty of Paying Living Expenses: Not hard at all  Food Insecurity: No Food Insecurity (07/05/2024)   Epic    Worried About Programme Researcher, Broadcasting/film/video in the Last Year: Never true    Ran Out of Food in the Last Year: Never true  Transportation Needs: No Transportation Needs (07/05/2024)   Epic    Lack of Transportation (Medical): No    Lack of Transportation (Non-Medical): No  Physical Activity: Sufficiently Active (07/05/2024)   Exercise Vital Sign    Days of Exercise per Week: 4 days    Minutes of Exercise per Session: 60 min  Stress: No Stress Concern Present (07/05/2024)   [employer] of Occupational Health - Occupational Stress Questionnaire    Feeling of Stress: Not at all  Social Connections: Unknown (07/05/2024)   Social Connection and Isolation Panel    Frequency of Communication with Friends and  Family: More than three times a week    Frequency of Social Gatherings with Friends and Family: More than three times a week    Attends Religious Services: Not on file    Active Member of Clubs or Organizations: Yes    Attends Banker Meetings: More than 4 times per year    Marital Status: Widowed  Intimate Partner Violence: Not At Risk (09/09/2023)   Humiliation, Afraid, Rape, and Kick questionnaire    Fear of Current or Ex-Partner: No    Emotionally Abused: No    Physically Abused: No    Sexually Abused: No  Depression (PHQ2-9): Low Risk (09/09/2023)   Depression (PHQ2-9)    PHQ-2 Score: 0  Alcohol Screen: Low Risk (09/09/2023)   Alcohol Screen    Last Alcohol Screening Score (AUDIT): 1  Housing: Unknown (02/21/2024)   Epic    Unable to Pay for Housing in the Last Year: No    Number of Times Moved in the Last Year: Not on file    Homeless in the Last Year: No  Utilities: Not At Risk (09/09/2023)   [employer] Utilities    Threatened with loss of utilities: No  Health Literacy: Adequate Health Literacy (09/09/2023)   B1300 Health Literacy    Frequency of need for help with medical instructions: Never    PHYSICAL EXAM  GENERAL EXAM/CONSTITUTIONAL: Vitals:  Vitals:   09/19/24 1043  BP: 122/70  Pulse: 85  SpO2: 98%  Weight: 173 lb 8 oz (78.7 kg)  Height: 5' 10 (1.778 m)    Body mass index is 24.89 kg/m. Wt Readings from Last 3 Encounters:  09/19/24 173 lb 8 oz (78.7 kg)  09/17/24 174 lb 3.2 oz (79 kg)  08/30/24 173 lb 6.4 oz (78.7 kg)   Patient is in no distress; well developed, nourished and groomed; neck is supple  MUSCULOSKELETAL: Gait, strength, tone, movements noted in Neurologic exam below  NEUROLOGIC: MENTAL STATUS:      No data to display         awake, alert, oriented to person, place and time recent and remote memory intact normal attention and concentration language fluent, comprehension intact, naming intact fund of knowledge  appropriate  CRANIAL NERVE:  2nd, 3rd, 4th, 6th - Visual fields full to confrontation, extraocular muscles intact, no nystagmus 5th - facial sensation symmetric 7th - facial strength symmetric 8th - hearing intact 9th - palate elevates symmetrically, uvula midline 11th - shoulder shrug symmetric 12th - tongue protrusion midline  MOTOR:  normal bulk and tone, full strength in the BUE, BLE  SENSORY:  normal and symmetric to light touch  COORDINATION:  finger-nose-finger, fine finger movements normal  GAIT/STATION:  normal   DIAGNOSTIC DATA (LABS, IMAGING, TESTING) - I reviewed patient records, labs, notes, testing and imaging myself where available.  Lab Results  Component Value Date   WBC 5.1 04/05/2024   HGB 14.3 04/05/2024   HCT 42.6 04/05/2024   MCV 90.7 04/05/2024   PLT 248.0 04/05/2024      Component Value Date/Time   NA 136 02/22/2024 1723   K 4.0 02/22/2024 1723   CL 99 02/22/2024 1723   CO2 26 02/22/2024 1723   GLUCOSE 88 02/22/2024 1723   BUN 9 02/22/2024 1723   CREATININE 0.69 02/22/2024 1723   CALCIUM  9.2 02/22/2024 1723   PROT 6.9 06/05/2024 0828   ALBUMIN 4.4 06/05/2024 0828   AST 20 06/05/2024 0828   ALT 15 06/05/2024 0828   ALKPHOS 51 06/05/2024 0828   BILITOT 0.5 06/05/2024 0828   GFRNONAA >60 02/22/2024 1723   GFRAA >60 02/26/2020 1626   Lab Results  Component Value Date   CHOL 200 (H) 06/05/2024   HDL 62 06/05/2024   LDLCALC 126 (H) 06/05/2024   TRIG 64 06/05/2024   CHOLHDL 3.2 06/05/2024   No results found for: HGBA1C Lab Results  Component Value Date   VITAMINB12 377 06/03/2023   Lab Results  Component Value Date   TSH 1.83 02/22/2024    CT head 79/2025 1. No acute intracranial CT findings. 2. Age related cerebral cortical volume loss and mild small-vessel disease.    ASSESSMENT AND PLAN  79 y.o. year old female with medical history including hypertension, hyperlipidemia, recent DVT on Eliquis , increase  anxiety/stress who is presenting with multiple complaints including near syncope, chest pain, brain fog, tingling in fingers.  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  She tells me that her symptoms are mildly improving but she still experiences multiple symptoms varying from tremor to brain fog, tightness around her head, numbness, tingling, tremors, facial flushing, hearing loss and tinnitus.  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 Gradual improvement is anticipated over 3-6 months; further intervention will depend on symptom progression. - Advised resumption of exercise and physical therapy, beginning with light activity and increasing as tolerated. - Recommended monitoring symptoms over the next 3-6 months for improvement. - Planned follow-up in 6 months, with earlier evaluation if symptoms worsen or fail to improve. - If no improvement at follow-up, will consider repeat MRI to assess for ongoing dural enhancement or other changes.  Enhanced physiological tremor Enhanced physiological tremor, likely exacerbated by stress and anxiety, with no evidence of essential tremor or parkinsonism; benign and non-progressive. - Provided reassurance regarding benign nature of tremor. - Advised on stress management and calming techniques to reduce tremor severity.    1. Other intracranial hypotension   2. Brain fog   3. Anxiety      Patient Instructions  Continue current medications Continue to follow PCP Return in 6 months or sooner if worse  No orders of the defined types were placed in this encounter.   No orders of the defined  types were placed in this  encounter.   Return in about 6 months (around 03/19/2025).  I personally spent a total of 50 minutes in the care of the patient today including preparing to see the patient, getting/reviewing separately obtained history, performing a medically appropriate exam/evaluation, counseling and educating, placing orders, documenting clinical information in the EHR, independently interpreting results, communicating results, and discussed her multiple symptoms and providing reassurance.  Pastor Falling, MD 09/19/2024, 1:39 PM  [facility] [address] [place], [place] [address] [phone]  "

## 2024-09-19 NOTE — Patient Instructions (Signed)
 Continue current medications Continue to follow PCP Return in 6 months or sooner if worse

## 2024-09-24 ENCOUNTER — Encounter (HOSPITAL_COMMUNITY)

## 2024-10-04 ENCOUNTER — Encounter (HOSPITAL_COMMUNITY)

## 2024-11-05 ENCOUNTER — Ambulatory Visit: Admitting: Dermatology

## 2024-11-16 ENCOUNTER — Ambulatory Visit

## 2024-11-20 ENCOUNTER — Ambulatory Visit (INDEPENDENT_AMBULATORY_CARE_PROVIDER_SITE_OTHER): Admitting: Otolaryngology

## 2024-12-17 ENCOUNTER — Ambulatory Visit: Admitting: Internal Medicine

## 2025-01-14 ENCOUNTER — Ambulatory Visit: Admitting: Family Medicine

## 2025-03-18 ENCOUNTER — Ambulatory Visit: Admitting: Neurology

## 2025-04-08 ENCOUNTER — Ambulatory Visit: Admitting: Neurology
